# Patient Record
Sex: Male | Born: 1990 | Race: White | Hispanic: No | Marital: Single | Smoking: Never smoker
Health system: Southern US, Community
[De-identification: ages and names within clinical notes are randomized; demographics above are authoritative.]

## PROBLEM LIST (undated history)

## (undated) DIAGNOSIS — B2700 Gammaherpesviral mononucleosis without complication: Secondary | ICD-10-CM

## (undated) DIAGNOSIS — K819 Cholecystitis, unspecified: Secondary | ICD-10-CM

## (undated) DIAGNOSIS — K829 Disease of gallbladder, unspecified: Secondary | ICD-10-CM

## (undated) DIAGNOSIS — J189 Pneumonia, unspecified organism: Secondary | ICD-10-CM

## (undated) DIAGNOSIS — F909 Attention-deficit hyperactivity disorder, unspecified type: Secondary | ICD-10-CM

## (undated) HISTORY — PX: THUMB AMPUTATION: SHX804

---

## 2010-02-04 ENCOUNTER — Emergency Department (HOSPITAL_COMMUNITY)
Admission: EM | Admit: 2010-02-04 | Discharge: 2010-02-04 | Payer: Self-pay | Source: Home / Self Care | Admitting: Emergency Medicine

## 2010-04-26 LAB — BASIC METABOLIC PANEL
BUN: 10 mg/dL (ref 6–23)
Calcium: 8.8 mg/dL (ref 8.4–10.5)
Creatinine, Ser: 1.2 mg/dL (ref 0.4–1.5)

## 2010-04-26 LAB — CBC
HCT: 44.2 % (ref 39.0–52.0)
Hemoglobin: 16.3 g/dL (ref 13.0–17.0)
MCH: 31.5 pg (ref 26.0–34.0)
MCHC: 36.9 g/dL — ABNORMAL HIGH (ref 30.0–36.0)
MCV: 85.3 fL (ref 78.0–100.0)
RBC: 5.18 MIL/uL (ref 4.22–5.81)
WBC: 10.2 10*3/uL (ref 4.0–10.5)

## 2010-04-26 LAB — DIFFERENTIAL
Eosinophils Absolute: 0.3 10*3/uL (ref 0.0–0.7)
Monocytes Absolute: 0.8 10*3/uL (ref 0.1–1.0)
Monocytes Relative: 8 % (ref 3–12)
Neutro Abs: 7.9 10*3/uL — ABNORMAL HIGH (ref 1.7–7.7)

## 2014-01-12 ENCOUNTER — Emergency Department (HOSPITAL_COMMUNITY): Payer: Medicaid - Out of State

## 2014-01-12 ENCOUNTER — Inpatient Hospital Stay (HOSPITAL_COMMUNITY)
Admission: EM | Admit: 2014-01-12 | Discharge: 2014-01-21 | DRG: 871 | Disposition: A | Discharge status: Home / Self Care | Payer: Medicaid - Out of State | Attending: Pulmonary Disease | Admitting: Pulmonary Disease

## 2014-01-12 ENCOUNTER — Encounter (HOSPITAL_COMMUNITY): Payer: Self-pay | Admitting: Emergency Medicine

## 2014-01-12 DIAGNOSIS — F909 Attention-deficit hyperactivity disorder, unspecified type: Secondary | ICD-10-CM | POA: Diagnosis present

## 2014-01-12 DIAGNOSIS — E860 Dehydration: Secondary | ICD-10-CM | POA: Diagnosis present

## 2014-01-12 DIAGNOSIS — J9690 Respiratory failure, unspecified, unspecified whether with hypoxia or hypercapnia: Secondary | ICD-10-CM

## 2014-01-12 DIAGNOSIS — E871 Hypo-osmolality and hyponatremia: Secondary | ICD-10-CM | POA: Diagnosis present

## 2014-01-12 DIAGNOSIS — N179 Acute kidney failure, unspecified: Secondary | ICD-10-CM | POA: Diagnosis present

## 2014-01-12 DIAGNOSIS — R05 Cough: Secondary | ICD-10-CM | POA: Diagnosis present

## 2014-01-12 DIAGNOSIS — N390 Urinary tract infection, site not specified: Secondary | ICD-10-CM | POA: Diagnosis present

## 2014-01-12 DIAGNOSIS — J9601 Acute respiratory failure with hypoxia: Secondary | ICD-10-CM | POA: Diagnosis present

## 2014-01-12 DIAGNOSIS — N289 Disorder of kidney and ureter, unspecified: Secondary | ICD-10-CM

## 2014-01-12 DIAGNOSIS — R000 Tachycardia, unspecified: Secondary | ICD-10-CM

## 2014-01-12 DIAGNOSIS — A419 Sepsis, unspecified organism: Secondary | ICD-10-CM

## 2014-01-12 DIAGNOSIS — Z881 Allergy status to other antibiotic agents status: Secondary | ICD-10-CM

## 2014-01-12 DIAGNOSIS — K5900 Constipation, unspecified: Secondary | ICD-10-CM | POA: Diagnosis present

## 2014-01-12 DIAGNOSIS — R0902 Hypoxemia: Secondary | ICD-10-CM

## 2014-01-12 DIAGNOSIS — R1110 Vomiting, unspecified: Secondary | ICD-10-CM

## 2014-01-12 DIAGNOSIS — J9600 Acute respiratory failure, unspecified whether with hypoxia or hypercapnia: Secondary | ICD-10-CM

## 2014-01-12 DIAGNOSIS — Z4659 Encounter for fitting and adjustment of other gastrointestinal appliance and device: Secondary | ICD-10-CM

## 2014-01-12 DIAGNOSIS — R059 Cough, unspecified: Secondary | ICD-10-CM

## 2014-01-12 DIAGNOSIS — R6520 Severe sepsis without septic shock: Secondary | ICD-10-CM | POA: Diagnosis present

## 2014-01-12 DIAGNOSIS — D696 Thrombocytopenia, unspecified: Secondary | ICD-10-CM | POA: Diagnosis present

## 2014-01-12 DIAGNOSIS — E663 Overweight: Secondary | ICD-10-CM | POA: Diagnosis present

## 2014-01-12 DIAGNOSIS — Z6826 Body mass index (BMI) 26.0-26.9, adult: Secondary | ICD-10-CM | POA: Diagnosis not present

## 2014-01-12 DIAGNOSIS — J189 Pneumonia, unspecified organism: Secondary | ICD-10-CM | POA: Diagnosis present

## 2014-01-12 DIAGNOSIS — Z9289 Personal history of other medical treatment: Secondary | ICD-10-CM

## 2014-01-12 LAB — URINE MICROSCOPIC-ADD ON

## 2014-01-12 LAB — URINALYSIS, ROUTINE W REFLEX MICROSCOPIC
Glucose, UA: 100 mg/dL — AB
KETONES UR: 15 mg/dL — AB
Leukocytes, UA: NEGATIVE
NITRITE: NEGATIVE
SPECIFIC GRAVITY, URINE: 1.025 (ref 1.005–1.030)
Urobilinogen, UA: >8 mg/dL — ABNORMAL HIGH (ref 0.0–1.0)
pH: 6.5 (ref 5.0–8.0)

## 2014-01-12 LAB — COMPREHENSIVE METABOLIC PANEL
ALBUMIN: 3.3 g/dL — AB (ref 3.5–5.2)
ALT: 50 U/L (ref 0–53)
AST: 63 U/L — AB (ref 0–37)
Alkaline Phosphatase: 73 U/L (ref 39–117)
Anion gap: 14 (ref 5–15)
BUN: 15 mg/dL (ref 6–23)
CO2: 27 mEq/L (ref 19–32)
CREATININE: 1.47 mg/dL — AB (ref 0.50–1.35)
Calcium: 8.6 mg/dL (ref 8.4–10.5)
Chloride: 93 mEq/L — ABNORMAL LOW (ref 96–112)
GFR calc Af Amer: 76 mL/min — ABNORMAL LOW (ref >90)
GFR calc non Af Amer: 65 mL/min — ABNORMAL LOW (ref >90)
GLUCOSE: 105 mg/dL — AB (ref 70–99)
POTASSIUM: 4.1 meq/L (ref 3.7–5.3)
Sodium: 134 mEq/L — ABNORMAL LOW (ref 137–147)
TOTAL PROTEIN: 7.3 g/dL (ref 6.0–8.3)
Total Bilirubin: 2 mg/dL — ABNORMAL HIGH (ref 0.3–1.2)

## 2014-01-12 LAB — CBC WITH DIFFERENTIAL/PLATELET
Basophils Absolute: 0 10*3/uL (ref 0.0–0.1)
Basophils Relative: 0 % (ref 0–1)
EOS ABS: 0 10*3/uL (ref 0.0–0.7)
EOS PCT: 0 % (ref 0–5)
HCT: 45.1 % (ref 39.0–52.0)
HEMOGLOBIN: 15.8 g/dL (ref 13.0–17.0)
LYMPHS ABS: 0.5 10*3/uL — AB (ref 0.7–4.0)
Lymphocytes Relative: 9 % — ABNORMAL LOW (ref 12–46)
MCH: 30.2 pg (ref 26.0–34.0)
MCHC: 35 g/dL (ref 30.0–36.0)
MCV: 86.2 fL (ref 78.0–100.0)
MONOS PCT: 4 % (ref 3–12)
Monocytes Absolute: 0.2 10*3/uL (ref 0.1–1.0)
Neutro Abs: 4.6 10*3/uL (ref 1.7–7.7)
Neutrophils Relative %: 87 % — ABNORMAL HIGH (ref 43–77)
PLATELETS: 118 10*3/uL — AB (ref 150–400)
RBC: 5.23 MIL/uL (ref 4.22–5.81)
RDW: 12.8 % (ref 11.5–15.5)
WBC: 5.3 10*3/uL (ref 4.0–10.5)

## 2014-01-12 LAB — LIPASE, BLOOD: Lipase: 50 U/L (ref 11–59)

## 2014-01-12 MED ORDER — SODIUM CHLORIDE 0.9 % IV SOLN
INTRAVENOUS | Status: DC
  Administered 2014-01-12: 15:00:00 via INTRAVENOUS
  Administered 2014-01-16: 1000 mL via INTRAVENOUS
  Administered 2014-01-16 – 2014-01-18 (×4): via INTRAVENOUS

## 2014-01-12 MED ORDER — LEVOFLOXACIN IN D5W 750 MG/150ML IV SOLN
750.0000 mg | INTRAVENOUS | Status: DC
  Administered 2014-01-13 – 2014-01-16 (×4): 750 mg via INTRAVENOUS
  Filled 2014-01-12 (×5): qty 150

## 2014-01-12 MED ORDER — LEVOFLOXACIN IN D5W 750 MG/150ML IV SOLN
750.0000 mg | Freq: Once | INTRAVENOUS | Status: AC
  Administered 2014-01-12: 750 mg via INTRAVENOUS
  Filled 2014-01-12: qty 150

## 2014-01-12 MED ORDER — ACETAMINOPHEN 650 MG RE SUPP: 650.0000 mg | Freq: Four times a day (QID) | RECTAL | Status: DC | PRN

## 2014-01-12 MED ORDER — ONDANSETRON HCL 4 MG/2ML IJ SOLN
4.0000 mg | Freq: Four times a day (QID) | INTRAMUSCULAR | Status: DC | PRN
  Administered 2014-01-13 – 2014-01-16 (×2): 4 mg via INTRAVENOUS
  Filled 2014-01-12 (×2): qty 2

## 2014-01-12 MED ORDER — SODIUM CHLORIDE 0.9 % IV BOLUS (SEPSIS)
1000.0000 mL | Freq: Once | INTRAVENOUS | Status: AC
  Administered 2014-01-12: 1000 mL via INTRAVENOUS

## 2014-01-12 MED ORDER — LEVOFLOXACIN IN D5W 750 MG/150ML IV SOLN: 750.0000 mg | Freq: Once | INTRAVENOUS | Status: DC

## 2014-01-12 MED ORDER — HYDROCODONE-ACETAMINOPHEN 5-325 MG PO TABS
1.0000 | ORAL_TABLET | ORAL | Status: DC | PRN
  Administered 2014-01-13 – 2014-01-15 (×3): 2 via ORAL
  Filled 2014-01-12 (×3): qty 2

## 2014-01-12 MED ORDER — PROMETHAZINE HCL 12.5 MG PO TABS: 25.0000 mg | ORAL_TABLET | Freq: Four times a day (QID) | ORAL | Status: DC | PRN

## 2014-01-12 MED ORDER — ACETAMINOPHEN 10 MG/ML IV SOLN
1000.0000 mg | Freq: Once | INTRAVENOUS | Status: AC
  Administered 2014-01-12: 1000 mg via INTRAVENOUS
  Filled 2014-01-12: qty 100

## 2014-01-12 MED ORDER — IPRATROPIUM-ALBUTEROL 0.5-2.5 (3) MG/3ML IN SOLN
3.0000 mL | Freq: Once | RESPIRATORY_TRACT | Status: AC
  Administered 2014-01-12: 3 mL via RESPIRATORY_TRACT
  Filled 2014-01-12: qty 3

## 2014-01-12 MED ORDER — ACETAMINOPHEN 10 MG/ML IV SOLN
INTRAVENOUS | Status: AC
  Filled 2014-01-12: qty 100

## 2014-01-12 MED ORDER — ONDANSETRON HCL 4 MG/2ML IJ SOLN
4.0000 mg | INTRAMUSCULAR | Status: DC | PRN
  Administered 2014-01-12: 4 mg via INTRAVENOUS
  Filled 2014-01-12: qty 2

## 2014-01-12 MED ORDER — IBUPROFEN 800 MG/8ML IV SOLN: 400.0000 mg | INTRAVENOUS | Status: DC | PRN

## 2014-01-12 MED ORDER — DOCUSATE SODIUM 100 MG PO CAPS
100.0000 mg | ORAL_CAPSULE | Freq: Every day | ORAL | Status: DC | PRN
  Filled 2014-01-12: qty 1

## 2014-01-12 MED ORDER — ACETAMINOPHEN 325 MG PO TABS
650.0000 mg | ORAL_TABLET | Freq: Four times a day (QID) | ORAL | Status: DC | PRN
Start: 2014-01-12 — End: 2014-01-16
  Administered 2014-01-12 – 2014-01-14 (×4): 650 mg via ORAL
  Filled 2014-01-12 (×4): qty 2

## 2014-01-12 MED ORDER — GUAIFENESIN-DM 100-10 MG/5ML PO SYRP
5.0000 mL | ORAL_SOLUTION | ORAL | Status: DC | PRN
  Filled 2014-01-12 (×2): qty 5

## 2014-01-12 MED ORDER — ALUM & MAG HYDROXIDE-SIMETH 200-200-20 MG/5ML PO SUSP
30.0000 mL | Freq: Four times a day (QID) | ORAL | Status: DC | PRN
  Filled 2014-01-12: qty 30

## 2014-01-12 MED ORDER — SODIUM CHLORIDE 0.9 % IV SOLN
INTRAVENOUS | Status: DC
  Administered 2014-01-12 – 2014-01-15 (×8): via INTRAVENOUS

## 2014-01-12 NOTE — ED Notes (Signed)
Pt tolerated ambulation around nurses station with o2 lowest reading at 87% on room air and highest pulse at 162.

## 2014-01-12 NOTE — H&P (Signed)
History and Physical  Omar Rodriguez WUJ:[id number] DOB: 04/19/1990 DOA: 01/12/2014  Referring physician: Dr Clarene DukeMcManus, ED physician PCP: No PCP Per Patient   Chief Complaint: Cough, fever, vomiting  HPI: Omar Rodriguez is a 23 y.o. male  Who is otherwise healthy who presents to the emergency department with vomiting for 6 days with fever, chills, cough. His symptoms have been worsening. He was [facility] on Thursday and was diagnosed with gastroenteritis and sent home with Zofran. He continued to vomit despite the Zofran. He has a productive cough with green and yellow sputum production which has been worsening over the past several days. He has not been able to eat or drink without vomiting. Emesis just contains food contents.   Review of Systems:   Pt complains of abdominal pain, shortness of breath, musculoskeletal chest pain, myalgias.  Pt denies any wheezing, palpitations, headache, blurred vision, diarrhea, constipation.  Review of systems are otherwise negative  History reviewed. No pertinent past medical history. History reviewed. No pertinent past surgical history. Social History:  reports that he has never smoked. He has never used smokeless tobacco. He reports that he does not drink alcohol or use illicit drugs. Patient lives at home & is able to participate in activities of daily living  Allergies  Allergen Reactions  . Ceclor [Cefaclor] Hives    Family History  Problem Relation Age of Onset  . Diabetes Father   . Hypertension Father       Prior to Admission medications   Medication Sig Start Date End Date Taking? Authorizing Provider  ondansetron (ZOFRAN) 4 MG tablet Take 4 mg by mouth every 8 (eight) hours as needed for nausea or vomiting.   Yes Historical Provider, MD    Physical Exam: BP 112/74 mmHg  Pulse 136  Temp(Src) 97.8 F (36.6 C) (Oral)  Resp 22  Ht 5\' 8"  (1.727 m)  Wt 81.647 kg (180 lb)  BMI 27.38 kg/m2  SpO2 93%  General:  Young Caucasian male. Awake and alert and oriented x3. Patient is quite Tachypneic.  Additionally, patient is hypoxic at rest and in conversation me with oxygen saturations into the mid 80s with good waveform on the pulse oximetry. Eyes: Pupils equal, round, reactive to light. Extraocular muscles are intact. Sclerae anicteric and noninjected.  ENT: External auditory canals are patent and tympanic membranes reflect a good cone of light. Dry mucosal membranes. No mucosal lesions. Neck: Neck supple without lymphadenopathy. No carotid bruits. No masses palpated.  Cardiovascular: Tachycardic with normal S1-S2 sounds. No murmurs, rubs, gallops auscultated. No JVD.  Respiratory: Shallow breaths with rales in the left lower lobe and the right middle lobe. Abdomen: Soft, epigastric tenderness, nondistended. Active bowel sounds. No masses or hepatosplenomegaly  Skin: Dry, warm to touch. 2+ dorsalis pedis and radial pulses. Musculoskeletal: No calf or leg pain. All major joints not erythematous nontender. Ribs tender to palpation Psychiatric: Intact judgment and insight.  Neurologic: No focal neurological deficits. Cranial nerves II through XII are grossly intact.           Labs on Admission:  Basic Metabolic Panel:  Recent Labs Lab 01/12/14 1327  NA 134*  K 4.1  CL 93*  CO2 27  GLUCOSE 105*  BUN 15  CREATININE 1.47*  CALCIUM 8.6   Liver Function Tests:  Recent Labs Lab 01/12/14 1327  AST 63*  ALT 50  ALKPHOS 73  BILITOT 2.0*  PROT 7.3  ALBUMIN 3.3*    Recent Labs Lab 01/12/14  1327  LIPASE 50   No results for input(s): AMMONIA in the last 168 hours. CBC:  Recent Labs Lab 01/12/14 1327  WBC 5.3  NEUTROABS 4.6  HGB 15.8  HCT 45.1  MCV 86.2  PLT 118*   Cardiac Enzymes: No results for input(s): CKTOTAL, CKMB, CKMBINDEX, TROPONINI in the last 168 hours.  BNP (last 3 results) No results for input(s): PROBNP in the last 8760 hours. CBG: No results for input(s): GLUCAP  in the last 168 hours.  Radiological Exams on Admission: Dg Chest 2 View  01/12/2014   CLINICAL DATA:  Productive cough.  EXAM: CHEST  2 VIEW  COMPARISON:  January 09, 2014.  FINDINGS: The heart size and mediastinal contours are within normal limits. No pneumothorax or pleural effusion is noted. Interval development of large airspace opacity seen in left lower lobe consistent with pneumonia. New right perihilar opacity is noted also consistent with pneumonia. The visualized skeletal structures are unremarkable.  IMPRESSION: New left lower lobe and right perihilar pneumonia. Follow-up radiographs are recommended to ensure resolution.   Electronically Signed   By: Roque LiasJames  Green M.D.   On: 01/12/2014 14:11    Assessment/Plan Present on Admission:  . Acute respiratory failure with hypoxia . Dehydration . CAP (community acquired pneumonia) . UTI (lower urinary tract infection)  #1 acute respiratory failure with hypoxia #2 left lower lobe and right perihilar pneumonia - community-acquired #3 severe dehydration secondary to #2 #4 acute renal insufficiency secondary to #3 #5 question of a UTI  Due to the severe dehydration, continued hypoxia at rest, will admit the patient for the community-acquired pneumonia. Continue patient on Levaquin. Patient receiving third bolus of fluid in the ER - we'll continue at 150 ML's per hour, which should resolve the patient's renal insufficiency secondary to his dehydration.  Although the urine dip is not impressive for infection with the absent leukocytes and negative nitrites, there are many bacteria on the micro with rare squamous cells. Levaquin should cover most pathogens.    DVT prophylaxis: Ambulation - patient at low risk due to age  Consultants: None  Code Status: Full code  Family Communication: Father and mother in the room   Disposition Plan: Home following improvement  Time spent: 8470 minutes  Omar CelesteJacob Brilynn Rodriguez, [place]DO [facility] Pager  [phone]

## 2014-01-12 NOTE — ED Notes (Signed)
Given water and regular gingerale.

## 2014-01-12 NOTE — ED Notes (Signed)
Patient c/o cough, fever, and vomiting x1 week. Patient seen at [facility] last [facility], diagnosed with UTI and viral infection- given Zofran with no relief from vomiting. Patient reports chest pain with cough. Per patient highest fever 103.6. Patient also reports headache.

## 2014-01-12 NOTE — ED Notes (Signed)
Tolerated po intake well.  Drank about 12 ounces of water.

## 2014-01-12 NOTE — ED Provider Notes (Signed)
CSN: [id number]     Arrival date & time 01/12/14  1204 History   First MD Initiated Contact with Patient 01/12/14 1310     Chief Complaint  Patient presents with  . Cough  . Emesis      HPI  Pt was seen at 1320.  Per pt, c/o gradual onset and persistence of constant cough for the past 1 week. Has been associated with multiple intermittent episodes of N/V, generalized body aches/fatigue, chills and home fevers to "103.6." Pt was evaluated at [facility] ED 3 days ago, dx with "viral infection," rx zofran. States he continues to have N/V despite taking the zofran as rx. Denies rash, no CP/SOB, no diarrhea, no abd pain.    History reviewed. No pertinent past medical history.   History reviewed. No pertinent past surgical history.   Family History  Problem Relation Age of Onset  . Diabetes Father   . Hypertension Father    History  Substance Use Topics  . Smoking status: Never Smoker   . Smokeless tobacco: Never Used  . Alcohol Use: No    Review of Systems ROS: Statement: All systems negative except as marked or noted in the HPI; Constitutional: +fever and chills, generalized body aches/fatigue. ; ; Eyes: Negative for eye pain, redness and discharge. ; ; ENMT: Negative for ear pain, hoarseness, nasal congestion, sinus pressure and sore throat. ; ; Cardiovascular: Negative for chest pain, palpitations, diaphoresis, dyspnea and peripheral edema. ; ; Respiratory: +cough. Negative for wheezing and stridor. ; ; Gastrointestinal: +N/V. Negative for diarrhea, abdominal pain, blood in stool, hematemesis, jaundice and rectal bleeding. . ; ; Genitourinary: Negative for dysuria, flank pain and hematuria. ; ; Musculoskeletal: Negative for back pain and neck pain. Negative for swelling and trauma.; ; Skin: Negative for pruritus, rash, abrasions, blisters, bruising and skin lesion.; ; Neuro: Negative for headache, lightheadedness and neck stiffness. Negative for weakness, altered level of consciousness ,  altered mental status, extremity weakness, paresthesias, involuntary movement, seizure and syncope.      Allergies  Ceclor  Home Medications   Prior to Admission medications   Medication Sig Start Date End Date Taking? Authorizing Provider  ondansetron (ZOFRAN) 4 MG tablet Take 4 mg by mouth every 8 (eight) hours as needed for nausea or vomiting.   Yes Historical Provider, MD   BP 135/87 mmHg  Pulse 130  Temp(Src) 97.8 F (36.6 C) (Oral)  Resp 18  Ht 5\' 8"  (1.727 m)  Wt 180 lb (81.647 kg)  BMI 27.38 kg/m2  SpO2 91% Physical Exam  1325: Physical examination:  Nursing notes reviewed; Vital signs and O2 SAT reviewed;  Constitutional: Well developed, Well nourished, In no acute distress; Head:  Normocephalic, atraumatic; Eyes: EOMI, PERRL, No scleral icterus; ENMT: Mouth and pharynx normal, Mucous membranes dry and cracked; Neck: Supple, Full range of motion, No lymphadenopathy. No meningeal signs; Cardiovascular: Tachycardic rate and rhythm, No gallop; Respiratory: Breath sounds coarse & equal bilaterally, No wheezes. +moist cough during exam. Speaking full sentences with ease, Normal respiratory effort/excursion; Chest: Nontender, Movement normal; Abdomen: Soft, Nontender, Nondistended, Normal bowel sounds; Genitourinary: No CVA tenderness; Extremities: Pulses normal, No tenderness, No edema, No calf edema or asymmetry.; Neuro: AA&Ox3, Major CN grossly intact.  Speech clear. No gross focal motor or sensory deficits in extremities.; Skin: Color normal, Warm, Dry.   ED Course  Procedures     EKG Interpretation None      MDM  MDM Reviewed: previous chart, nursing note and vitals Reviewed previous:  labs Interpretation: x-ray and labs    Results for orders placed or performed during the hospital encounter of 01/12/14  Urinalysis, Routine w reflex microscopic  Result Value Ref Range   Color, Urine AMBER (A) YELLOW   APPearance CLOUDY (A) CLEAR   Specific Gravity, Urine 1.025  1.005 - 1.030   pH 6.5 5.0 - 8.0   Glucose, UA 100 (A) NEGATIVE mg/dL   Hgb urine dipstick LARGE (A) NEGATIVE   Bilirubin Urine MODERATE (A) NEGATIVE   Ketones, ur 15 (A) NEGATIVE mg/dL   Protein, ur >161>300 (A) NEGATIVE mg/dL   Urobilinogen, UA >0.9>8.0 (H) 0.0 - 1.0 mg/dL   Nitrite NEGATIVE NEGATIVE   Leukocytes, UA NEGATIVE NEGATIVE  CBC with Differential  Result Value Ref Range   WBC 5.3 4.0 - 10.5 K/uL   RBC 5.23 4.22 - 5.81 MIL/uL   Hemoglobin 15.8 13.0 - 17.0 g/dL   HCT 60.445.1 54.039.0 - 98.152.0 %   MCV 86.2 78.0 - 100.0 fL   MCH 30.2 26.0 - 34.0 pg   MCHC 35.0 30.0 - 36.0 g/dL   RDW 19.112.8 47.811.5 - 29.515.5 %   Platelets 118 (L) 150 - 400 K/uL   Neutrophils Relative % 87 (H) 43 - 77 %   Neutro Abs 4.6 1.7 - 7.7 K/uL   Lymphocytes Relative 9 (L) 12 - 46 %   Lymphs Abs 0.5 (L) 0.7 - 4.0 K/uL   Monocytes Relative 4 3 - 12 %   Monocytes Absolute 0.2 0.1 - 1.0 K/uL   Eosinophils Relative 0 0 - 5 %   Eosinophils Absolute 0.0 0.0 - 0.7 K/uL   Basophils Relative 0 0 - 1 %   Basophils Absolute 0.0 0.0 - 0.1 K/uL   Smear Review SPECIMEN CHECKED FOR CLOTS   Comprehensive metabolic panel  Result Value Ref Range   Sodium 134 (L) 137 - 147 mEq/L   Potassium 4.1 3.7 - 5.3 mEq/L   Chloride 93 (L) 96 - 112 mEq/L   CO2 27 19 - 32 mEq/L   Glucose, Bld 105 (H) 70 - 99 mg/dL   BUN 15 6 - 23 mg/dL   Creatinine, Ser 6.211.47 (H) 0.50 - 1.35 mg/dL   Calcium 8.6 8.4 - 30.810.5 mg/dL   Total Protein 7.3 6.0 - 8.3 g/dL   Albumin 3.3 (L) 3.5 - 5.2 g/dL   AST 63 (H) 0 - 37 U/L   ALT 50 0 - 53 U/L   Alkaline Phosphatase 73 39 - 117 U/L   Total Bilirubin 2.0 (H) 0.3 - 1.2 mg/dL   GFR calc non Af Amer 65 (L) >90 mL/min   GFR calc Af Amer 76 (L) >90 mL/min   Anion gap 14 5 - 15  Lipase, blood  Result Value Ref Range   Lipase 50 11 - 59 U/L  Urine microscopic-add on  Result Value Ref Range   Squamous Epithelial / LPF RARE RARE   WBC, UA 7-10 <3 WBC/hpf   RBC / HPF 7-10 <3 RBC/hpf   Bacteria, UA MANY (A) RARE    Casts GRANULAR CAST (A) NEGATIVE   Dg Chest 2 View 01/12/2014   CLINICAL DATA:  Productive cough.  EXAM: CHEST  2 VIEW  COMPARISON:  January 09, 2014.  FINDINGS: The heart size and mediastinal contours are within normal limits. No pneumothorax or pleural effusion is noted. Interval development of large airspace opacity seen in left lower lobe consistent with pneumonia. New right perihilar opacity is noted also consistent with pneumonia.  The visualized skeletal structures are unremarkable.  IMPRESSION: New left lower lobe and right perihilar pneumonia. Follow-up radiographs are recommended to ensure resolution.   Electronically Signed   By: Roque LiasJames  Green M.D.   On: 01/12/2014 14:11    1555:  Pt tachycardic to 130's on arrival. BUN/Cr elevated c/w pt appearing clinically dehydrated. IVF x2L given without improvement. Nebs x2 given for O2 Sats 91% R/A at rest with minimal improvement. Pt ambulated with O2 Sats dropping to 87% R/A, HR increasing to 160's. Will continue IVF, start IV abx for CAP and UTI (UC pending), and admit. Dx and testing d/w pt and family.  Questions answered.  Verb understanding, agreeable to admit. T/C to [facility]. Adrian BlackwaterStinson, case discussed, including:  HPI, pertinent PM/SHx, VS/PE, dx testing, ED course and treatment:  Agreeable to come to ED for evaluation for admission.  Samuel JesterKathleen Thi Sisemore, DO 01/13/14 [phone]

## 2014-01-13 ENCOUNTER — Inpatient Hospital Stay (HOSPITAL_COMMUNITY): Payer: Medicaid - Out of State

## 2014-01-13 DIAGNOSIS — A419 Sepsis, unspecified organism: Secondary | ICD-10-CM

## 2014-01-13 LAB — CBC WITH DIFFERENTIAL/PLATELET
BASOS PCT: 0 % (ref 0–1)
Basophils Absolute: 0 10*3/uL (ref 0.0–0.1)
Eosinophils Absolute: 0 10*3/uL (ref 0.0–0.7)
Eosinophils Relative: 0 % (ref 0–5)
HEMATOCRIT: 41.1 % (ref 39.0–52.0)
Hemoglobin: 14.2 g/dL (ref 13.0–17.0)
LYMPHS PCT: 13 % (ref 12–46)
Lymphs Abs: 0.6 10*3/uL — ABNORMAL LOW (ref 0.7–4.0)
MCH: 30.1 pg (ref 26.0–34.0)
MCHC: 34.5 g/dL (ref 30.0–36.0)
MCV: 87.1 fL (ref 78.0–100.0)
MONO ABS: 0.3 10*3/uL (ref 0.1–1.0)
MONOS PCT: 6 % (ref 3–12)
Neutro Abs: 3.9 10*3/uL (ref 1.7–7.7)
Neutrophils Relative %: 81 % — ABNORMAL HIGH (ref 43–77)
Platelets: 107 10*3/uL — ABNORMAL LOW (ref 150–400)
RBC: 4.72 MIL/uL (ref 4.22–5.81)
RDW: 12.9 % (ref 11.5–15.5)
WBC: 4.8 10*3/uL (ref 4.0–10.5)

## 2014-01-13 LAB — COMPREHENSIVE METABOLIC PANEL
ALT: 39 U/L (ref 0–53)
ANION GAP: 14 (ref 5–15)
AST: 52 U/L — AB (ref 0–37)
Albumin: 2.4 g/dL — ABNORMAL LOW (ref 3.5–5.2)
Alkaline Phosphatase: 65 U/L (ref 39–117)
BILIRUBIN TOTAL: 1.2 mg/dL (ref 0.3–1.2)
BUN: 10 mg/dL (ref 6–23)
CHLORIDE: 101 meq/L (ref 96–112)
CO2: 24 meq/L (ref 19–32)
CREATININE: 1.21 mg/dL (ref 0.50–1.35)
Calcium: 7.5 mg/dL — ABNORMAL LOW (ref 8.4–10.5)
GFR calc Af Amer: >90 mL/min (ref >90)
GFR, EST NON AFRICAN AMERICAN: 83 mL/min — AB (ref >90)
Glucose, Bld: 96 mg/dL (ref 70–99)
Potassium: 4.2 mEq/L (ref 3.7–5.3)
Sodium: 139 mEq/L (ref 137–147)
Total Protein: 5.7 g/dL — ABNORMAL LOW (ref 6.0–8.3)

## 2014-01-13 LAB — BLOOD GAS, ARTERIAL
ACID-BASE DEFICIT: 0.8 mmol/L (ref 0.0–2.0)
Acid-base deficit: 0.6 mmol/L (ref 0.0–2.0)
BICARBONATE: 23.8 meq/L (ref 20.0–24.0)
Bicarbonate: 23.2 mEq/L (ref 20.0–24.0)
Drawn by: 21310
FIO2: 100 %
O2 CONTENT: 6 L/min
O2 Saturation: 91.8 %
O2 Saturation: 92.8 %
PCO2 ART: 41 mmHg (ref 35.0–45.0)
PCO2 ART: 36.9 mmHg (ref 35.0–45.0)
PH ART: 7.382 (ref 7.350–7.450)
PO2 ART: 62.4 mmHg — AB (ref 80.0–100.0)
PO2 ART: 64.7 mmHg — AB (ref 80.0–100.0)
Patient temperature: 37
Patient temperature: 37
TCO2: 21.1 mmol/L (ref 0–100)
TCO2: 20.6 mmol/L (ref 0–100)
pH, Arterial: 7.415 (ref 7.350–7.450)

## 2014-01-13 LAB — STREP PNEUMONIAE URINARY ANTIGEN: Strep Pneumo Urinary Antigen: NEGATIVE

## 2014-01-13 LAB — BASIC METABOLIC PANEL
Anion gap: 16 — ABNORMAL HIGH (ref 5–15)
BUN: 11 mg/dL (ref 6–23)
CO2: 22 meq/L (ref 19–32)
Calcium: 7.5 mg/dL — ABNORMAL LOW (ref 8.4–10.5)
Chloride: 100 mEq/L (ref 96–112)
Creatinine, Ser: 1.23 mg/dL (ref 0.50–1.35)
GFR calc Af Amer: >90 mL/min (ref >90)
GFR calc non Af Amer: 81 mL/min — ABNORMAL LOW (ref >90)
GLUCOSE: 85 mg/dL (ref 70–99)
POTASSIUM: 4.3 meq/L (ref 3.7–5.3)
Sodium: 138 mEq/L (ref 137–147)

## 2014-01-13 LAB — EXPECTORATED SPUTUM ASSESSMENT W GRAM STAIN, RFLX TO RESP C

## 2014-01-13 LAB — CBC
HCT: 40.9 % (ref 39.0–52.0)
HEMOGLOBIN: 14.2 g/dL (ref 13.0–17.0)
MCH: 30.2 pg (ref 26.0–34.0)
MCHC: 34.7 g/dL (ref 30.0–36.0)
MCV: 87 fL (ref 78.0–100.0)
Platelets: 100 10*3/uL — ABNORMAL LOW (ref 150–400)
RBC: 4.7 MIL/uL (ref 4.22–5.81)
RDW: 12.9 % (ref 11.5–15.5)
WBC: 4.9 10*3/uL (ref 4.0–10.5)

## 2014-01-13 LAB — INFLUENZA PANEL BY PCR (TYPE A & B)
H1N1 flu by pcr: NOT DETECTED
INFLAPCR: NEGATIVE
INFLBPCR: NEGATIVE

## 2014-01-13 LAB — PHOSPHORUS: PHOSPHORUS: 2.6 mg/dL (ref 2.3–4.6)

## 2014-01-13 LAB — EXPECTORATED SPUTUM ASSESSMENT W REFEX TO RESP CULTURE

## 2014-01-13 LAB — LACTIC ACID, PLASMA: Lactic Acid, Venous: 1.3 mmol/L (ref 0.5–2.2)

## 2014-01-13 LAB — PRO B NATRIURETIC PEPTIDE: Pro B Natriuretic peptide (BNP): 691.1 pg/mL — ABNORMAL HIGH (ref 0–125)

## 2014-01-13 LAB — MRSA PCR SCREENING: MRSA by PCR: NEGATIVE

## 2014-01-13 MED ORDER — LEVALBUTEROL HCL 0.63 MG/3ML IN NEBU
INHALATION_SOLUTION | RESPIRATORY_TRACT | Status: AC
  Filled 2014-01-13: qty 3

## 2014-01-13 MED ORDER — PIPERACILLIN-TAZOBACTAM 3.375 G IVPB
3.3750 g | Freq: Three times a day (TID) | INTRAVENOUS | Status: DC
  Administered 2014-01-13 – 2014-01-20 (×22): 3.375 g via INTRAVENOUS
  Filled 2014-01-13 (×28): qty 50

## 2014-01-13 MED ORDER — SODIUM CHLORIDE 0.9 % IV BOLUS (SEPSIS)
500.0000 mL | Freq: Once | INTRAVENOUS | Status: AC
  Administered 2014-01-13: 500 mL via INTRAVENOUS

## 2014-01-13 MED ORDER — VANCOMYCIN HCL IN DEXTROSE 1-5 GM/200ML-% IV SOLN
1000.0000 mg | Freq: Two times a day (BID) | INTRAVENOUS | Status: DC
  Administered 2014-01-13 – 2014-01-14 (×3): 1000 mg via INTRAVENOUS
  Filled 2014-01-13 (×6): qty 200

## 2014-01-13 MED ORDER — VANCOMYCIN HCL IN DEXTROSE 1-5 GM/200ML-% IV SOLN: 1000.0000 mg | Freq: Two times a day (BID) | INTRAVENOUS | Status: DC

## 2014-01-13 MED ORDER — LEVALBUTEROL HCL 0.63 MG/3ML IN NEBU
0.6300 mg | INHALATION_SOLUTION | Freq: Four times a day (QID) | RESPIRATORY_TRACT | Status: DC
  Administered 2014-01-13 – 2014-01-16 (×12): 0.63 mg via RESPIRATORY_TRACT
  Filled 2014-01-13 (×14): qty 3

## 2014-01-13 MED ORDER — SODIUM CHLORIDE 0.9 % IV SOLN
1500.0000 mg | Freq: Once | INTRAVENOUS | Status: DC
  Filled 2014-01-13: qty 1500

## 2014-01-13 MED ORDER — LORAZEPAM 2 MG/ML IJ SOLN
0.5000 mg | Freq: Once | INTRAMUSCULAR | Status: AC
  Administered 2014-01-13: 0.5 mg via INTRAVENOUS
  Filled 2014-01-13: qty 1

## 2014-01-13 MED ORDER — CHLORHEXIDINE GLUCONATE 0.12 % MT SOLN
15.0000 mL | Freq: Two times a day (BID) | OROMUCOSAL | Status: DC
  Administered 2014-01-13 – 2014-01-15 (×5): 15 mL via OROMUCOSAL
  Filled 2014-01-13 (×5): qty 15

## 2014-01-13 MED ORDER — CETYLPYRIDINIUM CHLORIDE 0.05 % MT LIQD
7.0000 mL | Freq: Two times a day (BID) | OROMUCOSAL | Status: DC
  Administered 2014-01-13 – 2014-01-15 (×3): 7 mL via OROMUCOSAL

## 2014-01-13 MED ORDER — GUAIFENESIN ER 600 MG PO TB12
1200.0000 mg | ORAL_TABLET | Freq: Two times a day (BID) | ORAL | Status: DC
  Administered 2014-01-13 – 2014-01-15 (×4): 1200 mg via ORAL
  Filled 2014-01-13 (×5): qty 2

## 2014-01-13 MED ORDER — PIPERACILLIN-TAZOBACTAM 3.375 G IVPB
3.3750 g | Freq: Three times a day (TID) | INTRAVENOUS | Status: DC
  Filled 2014-01-13: qty 50

## 2014-01-13 MED ORDER — OSELTAMIVIR PHOSPHATE 75 MG PO CAPS: 75.0000 mg | ORAL_CAPSULE | Freq: Two times a day (BID) | ORAL | Status: DC

## 2014-01-13 MED ORDER — VANCOMYCIN HCL 10 G IV SOLR
1500.0000 mg | Freq: Once | INTRAVENOUS | Status: AC
  Administered 2014-01-13: 1500 mg via INTRAVENOUS
  Filled 2014-01-13: qty 1500

## 2014-01-13 NOTE — Progress Notes (Signed)
eLink Physician-Brief Progress Note Patient Name: Omar Rodriguez DOB: 11/30/1990 MRN: [id number]   Date of Service  01/13/2014  HPI/Events of Note  23 year old male admitted following 5 day h/o of resp symptoms.  Now requiring higher O2 needs up to 5 liters [place] with sats in the low 90s and RR of 25 to 36.  HD stable but tachycardic.  PCXR shows enlarging left sided opacification consistent with PNA.  First dose of ABX at 4pm on 11/29.  ABG consistent with hypoxia.  eICU Interventions  Plan: Per primary team Continue with current treatment Low threshold to broaden coverage if patient remains hypoxic/unresponsive Consider BiPAP May require pulm consult     Intervention Category Evaluation Type: New Patient Evaluation  DETERDING,ELIZABETH 01/13/2014, 6:26 AM

## 2014-01-13 NOTE — Progress Notes (Signed)
Patients oxygen saturations have been varied from 95% to a low of 79% with oxygen by nasal cannula at 6lpm. Patient remained at 79% for extended time and we had to change to non-rebreather mask at 15 lpm flow with saturations now at 90%.

## 2014-01-13 NOTE — Consult Note (Signed)
Full note to follow. He is on appropriate treatment.

## 2014-01-13 NOTE — Clinical Documentation Improvement (Signed)
Medicare rules require specification as to whether an inpatient diagnosis was present at the time of admission.   "Sepsis" documented in progress note dated 01/13/14.  Sepsis also documented on current hospital problem list but Present on Admission is documented as "Unknown".  Please document if Sepsis was:   - Present on Admission, including clinical indicators   - Not Present on Admission but occurred following admission   - Unable to Clinically Determine   Thank You, Jerral Ralphathy R Rockelle Heuerman ,RN Clinical Documentation Specialist:  [phone] [facility]- Health Information Management

## 2014-01-13 NOTE — Progress Notes (Signed)
Patient transferred to icu/stepdown for closer monitoring and for higher level of care.  Father present with patient.  Patient in stable condition. Report given to icu nurse.

## 2014-01-13 NOTE — Progress Notes (Addendum)
Notified MD on call to inform him of patient's need for oxygen at 5liters. Patient' heart rate has decreased and patient's temperature.  Patient in no visible forms of respiratory distress. Patient's mentation is at baseline and has been alert and responsive all night.  Received orders from md.  Will continue to monitor patient condition.

## 2014-01-13 NOTE — Plan of Care (Signed)
Problem: Phase I Progression Outcomes Goal: Voiding-avoid urinary catheter unless indicated Outcome: Completed/Met Date Met:  01/13/14     

## 2014-01-13 NOTE — Progress Notes (Signed)
eLink Physician-Brief Progress Note Patient Name: Jonna Coupony C Hogle DOB: 05/21/1990 MRN: [id number]   Date of Service  01/13/2014  HPI/Events of Note    eICU Interventions  SCD placed     Intervention Category Intermediate Interventions: Best-practice therapies (e.g. DVT, beta blocker, etc.)  BYRUM,ROBERT S. 01/13/2014, 10:12 PM

## 2014-01-13 NOTE — Progress Notes (Signed)
[facility] PROGRESS NOTE  Omar Rodriguez EXB:[id number] DOB: 12/24/1990 DOA: 01/12/2014 PCP: No PCP Per Patient  Assessment/Plan:   Sepsis - Will continue to monitor blood pressures. - continue IVF rehydration - Broad spectrum antibiotics on board - obtain sputum culture - blood cultures not obtained initially. Will order but will keeping in mind that patient has already had antibiotics as such may affect results.  Acute respiratory failure with hypoxia - Most likely due to multifocal pneumonia - continue supplemental oxygen    Acute renal insufficiency - Suspect most likely 2ary to prerenal causes. - Improving with IVF rehydration - Will continue IVF's    Dehydration - Improved with IVF rehydration. - As patient continues to improve will have nursing encourage po intake    CAP (community acquired pneumonia) - Continue broad spectrum antibiotics - discussed with nursing about obtaining sputum culture. I have been indicated that this has occurred. - continue broad spectrum antibiotics  Code Status: full Family Communication: discussed with patient and family at bedside  Disposition Plan: Pending improvement in condition   Consultants:  None  Procedures:  None  Antibiotics:  Levaquin  Vancomycin  Zosyn  HPI/Subjective: Pt has no new complaints. No acute issues reported overnight. Family states that patient is more alert than he has been  Objective: Filed Vitals:   01/13/14 0900  BP:   Pulse: 122  Temp:   Resp: 34    Intake/Output Summary (Last 24 hours) at 01/13/14 0947 Last data filed at 01/13/14 [id number]  Gross per 24 hour  Intake   2505 ml  Output   1200 ml  Net   1305 ml   Filed Weights   01/12/14 1214 01/12/14 1709 01/13/14 0611  Weight: 81.647 kg (180 lb) 76.295 kg (168 lb 3.2 oz) 76.1 kg (167 lb 12.3 oz)    Exam:   General:  Pt in nad, alert and awake  Cardiovascular: rrr, no mrg  Respiratory: +Rhales, no wheezes, speaking in  full sentences  Abdomen: soft, Nd, nt   Musculoskeletal: no cyanosis or clubbing on limited exam   Data Reviewed: Basic Metabolic Panel:  Recent Labs Lab 01/12/14 1327 01/13/14 0728  NA 134* 139  K 4.1 4.2  CL 93* 101  CO2 27 24  GLUCOSE 105* 96  BUN 15 10  CREATININE 1.47* 1.21  CALCIUM 8.6 7.5*   Liver Function Tests:  Recent Labs Lab 01/12/14 1327 01/13/14 0728  AST 63* 52*  ALT 50 39  ALKPHOS 73 65  BILITOT 2.0* 1.2  PROT 7.3 5.7*  ALBUMIN 3.3* 2.4*    Recent Labs Lab 01/12/14 1327  LIPASE 50   No results for input(s): AMMONIA in the last 168 hours. CBC:  Recent Labs Lab 01/12/14 1327 01/13/14 0728  WBC 5.3 4.8  4.9  NEUTROABS 4.6 3.9  HGB 15.8 14.2  14.2  HCT 45.1 41.1  40.9  MCV 86.2 87.1  87.0  PLT 118* 107*  100*   Cardiac Enzymes: No results for input(s): CKTOTAL, CKMB, CKMBINDEX, TROPONINI in the last 168 hours. BNP (last 3 results) No results for input(s): PROBNP in the last 8760 hours. CBG: No results for input(s): GLUCAP in the last 168 hours.  No results found for this or any previous visit (from the past 240 hour(s)).   Studies: Dg Chest 2 View  01/12/2014   CLINICAL DATA:  Productive cough.  EXAM: CHEST  2 VIEW  COMPARISON:  January 09, 2014.  FINDINGS: The heart size and mediastinal contours are  within normal limits. No pneumothorax or pleural effusion is noted. Interval development of large airspace opacity seen in left lower lobe consistent with pneumonia. New right perihilar opacity is noted also consistent with pneumonia. The visualized skeletal structures are unremarkable.  IMPRESSION: New left lower lobe and right perihilar pneumonia. Follow-up radiographs are recommended to ensure resolution.   Electronically Signed   By: Roque LiasJames  Green M.D.   On: 01/12/2014 14:11   Ct Chest Wo Contrast  01/13/2014   CLINICAL DATA:  Fever, chills, cough, dehydration. Community acquired pneumonia.  EXAM: CT CHEST WITHOUT CONTRAST   TECHNIQUE: Multidetector CT imaging of the chest was performed following the standard protocol without IV contrast.  COMPARISON:  PA and lateral chest 01/09/2014 and 01/12/2014. Single view of the chest 01/13/2014.  FINDINGS: Trace right pleural effusion is identified. The patient has a small left pleural effusion. There is no pericardial effusion. Heart size is normal. There is no axillary, hilar or mediastinal lymphadenopathy. Extensive airspace disease is present in the left chest, worst in left lower lobe. Airspace disease is also seen in the right upper lobe. There is minimal dependent atelectasis in the right lung base.  Incidentally imaged upper abdomen is unremarkable. No focal bony abnormality is identified.  IMPRESSION: Airspace disease in the right upper lobe and throughout the left chest is most consistent with multifocal pneumonia. Associated small bilateral pleural effusions, greater on the left, are identified.   Electronically Signed   By: Drusilla Kannerhomas  Dalessio M.D.   On: 01/13/2014 08:52   Dg Chest Port 1 View  01/13/2014   CLINICAL DATA:  Acute onset of cough, fever and vomiting for 6 days. Decreased O2 saturation. Acute respiratory failure. Subsequent encounter.  EXAM: PORTABLE CHEST - 1 VIEW  COMPARISON:  Chest radiograph performed 01/12/2014  FINDINGS: There is significantly worsened left-sided airspace opacification, with mild sparing at the left lung apex. Mildly worsened right perihilar airspace opacity is also seen. Given the appearance, this is suspicious for severe multifocal pneumonia. A small left pleural effusion may be present. Underlying pulmonary edema cannot be excluded. No pneumothorax is seen.  The cardiomediastinal silhouette is not well assessed due to the opacification of the left hemithorax. No acute osseous abnormalities are identified.  IMPRESSION: Significantly worsened left-sided airspace opacification. Mildly worsened right perihilar airspace opacity also seen. Given the  appearance, this is suspicious for worsening severe multifocal pneumonia. Question of small left pleural effusion. Underlying pulmonary edema cannot be excluded.   Electronically Signed   By: Roanna RaiderJeffery  Chang M.D.   On: 01/13/2014 03:46    Scheduled Meds: . antiseptic oral rinse  7 mL Mouth Rinse q12n4p  . chlorhexidine  15 mL Mouth Rinse BID  . levalbuterol  0.63 mg Nebulization Q6H WA  . levofloxacin (LEVAQUIN) IV  750 mg Intravenous Q24H  . piperacillin-tazobactam (ZOSYN)  IV  3.375 g Intravenous Q8H  . sodium chloride  500 mL Intravenous Once  . vancomycin  1,500 mg Intravenous Once  . vancomycin  1,000 mg Intravenous Q12H   Continuous Infusions: . sodium chloride Stopped (01/12/14 1655)  . sodium chloride 150 mL/hr at 01/12/14 1807    Active Problems:   Time spent: > 35 minutes    Penny PiaVEGA, Denecia Brunette  [facility] Pager [phone]. If 7PM-7AM, please contact night-coverage at [email or web address], password [email or web address] 01/13/2014, 9:47 AM  LOS: 1 day

## 2014-01-13 NOTE — Care Management Utilization Note (Signed)
UR review complete.  

## 2014-01-13 NOTE — Progress Notes (Signed)
ANTIBIOTIC CONSULT NOTE - INITIAL  Pharmacy Consult for vancomycin & Zosyn Indication: aspiration pneumonia  Allergies  Allergen Reactions  . Ceclor [Cefaclor] Hives    Patient Measurements: Height: 5\' 8"  (172.7 cm) Weight: 167 lb 12.3 oz (76.1 kg) IBW/kg (Calculated) : 68.4 Adjusted Body Weight: 71 kg  Vital Signs: Temp: 99.4 F (37.4 C) (11/30 0611) Temp Source: Oral (11/30 62950611) BP: 115/77 mmHg (11/30 28410611) Pulse Rate: 112 (11/30 0539) Intake/Output from previous day: 11/29 0701 - 11/30 0700 In: 240 [P.O.:240] Out: 1200 [Urine:1200] Intake/Output from this shift: Total I/O In: -  Out: 800 [Urine:800]  Labs:  Recent Labs  01/12/14 1327  WBC 5.3  HGB 15.8  PLT 118*  CREATININE 1.47*   Estimated Creatinine Clearance: 75 mL/min (by C-G formula based on Cr of 1.47). No results for input(s): VANCOTROUGH, VANCOPEAK, VANCORANDOM, GENTTROUGH, GENTPEAK, GENTRANDOM, TOBRATROUGH, TOBRAPEAK, TOBRARND, AMIKACINPEAK, AMIKACINTROU, AMIKACIN in the last 72 hours.   Microbiology: No results found for this or any previous visit (from the past 720 hour(s)).  Medical History: History reviewed. No pertinent past medical history.  Medications:  Scheduled:  . antiseptic oral rinse  7 mL Mouth Rinse q12n4p  . chlorhexidine  15 mL Mouth Rinse BID  . levalbuterol  0.63 mg Nebulization Q6H WA  . levofloxacin (LEVAQUIN) IV  750 mg Intravenous Q24H  . piperacillin-tazobactam (ZOSYN)  IV  3.375 g Intravenous Q8H  . vancomycin  1,500 mg Intravenous Once  . vancomycin  1,000 mg Intravenous Q12H   Infusions:  . sodium chloride Stopped (01/12/14 1655)  . sodium chloride 150 mL/hr at 01/12/14 1807   PRN: acetaminophen **OR** acetaminophen, alum & mag hydroxide-simeth, docusate sodium, guaiFENesin-dextromethorphan, HYDROcodone-acetaminophen, ondansetron, ondansetron (ZOFRAN) IV, promethazine   Assessment: 89yr male in relatively good health PTA, now with complicated GI sx's and  probable aspiration pneumonia to be tx'd with vancomycin and Zosyn  Goal of Therapy:  Desire vancomycin trough level to be 15-5120mcg/ml.  Will use standard Zosyn regimen since CrCl>3420ml/min  Plan:  1.  Vancomycin 1500mg  IV x 1 loading dose 2.  Vancomycin 1000mg  IV q12h maintenance regimen 3.  Zosyn 3.75gm IV q8h (4hr infusion) 4.  Monitor indices for infection and renal function 5.  Measure actual serum steady state vancomycin trough level as clinically indicated  Trypp Heckmann E 01/13/2014,6:48 AM

## 2014-01-14 ENCOUNTER — Inpatient Hospital Stay (HOSPITAL_COMMUNITY): Payer: Medicaid - Out of State

## 2014-01-14 LAB — URINE CULTURE: Colony Count: 30000

## 2014-01-14 LAB — LEGIONELLA ANTIGEN, URINE

## 2014-01-14 LAB — HIV ANTIBODY (ROUTINE TESTING W REFLEX): HIV: NONREACTIVE

## 2014-01-14 MED ORDER — PROMETHAZINE HCL 25 MG PO TABS: 25.0000 mg | ORAL_TABLET | Freq: Four times a day (QID) | ORAL | Status: DC | PRN

## 2014-01-14 MED ORDER — LORAZEPAM BOLUS VIA INFUSION: 1.0000 mg | INTRAVENOUS | Status: DC | PRN

## 2014-01-14 MED ORDER — LORAZEPAM 2 MG/ML IJ SOLN
1.0000 mg | INTRAMUSCULAR | Status: DC | PRN
  Administered 2014-01-14 (×2): 1 mg via INTRAVENOUS
  Filled 2014-01-14 (×2): qty 1

## 2014-01-14 NOTE — Progress Notes (Signed)
Subjective: He says he feels better. He is less dyspneic and in less distress. His heart rate has come down. He has no new complaints.  Objective: Vital signs in last 24 hours: Temp:  [97.6 F (36.4 C)-103.1 F (39.5 C)] 98.5 F (36.9 C) (12/01 0400) Pulse Rate:  [100-140] 114 (12/01 0700) Resp:  [17-44] 20 (12/01 0700) BP: (91-156)/(43-79) 110/77 mmHg (12/01 0700) SpO2:  [84 %-99 %] 97 % (12/01 0713) Weight:  [79.8 kg (175 lb 14.8 oz)] 79.8 kg (175 lb 14.8 oz) (12/01 0500) Weight change: -1.848 kg (-4 lb 1.2 oz) Last BM Date:  (pt unsure/"been a couple of days")  Intake/Output from previous day: 11/30 0701 - 12/01 0700 In: 4580 [I.V.:3530; IV Piggyback:1050] Out: 2495 [Urine:2495]  PHYSICAL EXAM General appearance: mild distress Resp: rhonchi bilaterally Cardio: regular rate and rhythm, S1, S2 normal, no murmur, click, rub or gallop GI: soft, non-tender; bowel sounds normal; no masses,  no organomegaly Extremities: extremities normal, atraumatic, no cyanosis or edema  Lab Results:  Results for orders placed or performed during the hospital encounter of 01/12/14 (from the past 48 hour(s))  CBC with Differential     Status: Abnormal   Collection Time: 01/12/14  1:27 PM  Result Value Ref Range   WBC 5.3 4.0 - 10.5 K/uL   RBC 5.23 4.22 - 5.81 MIL/uL   Hemoglobin 15.8 13.0 - 17.0 g/dL   HCT 45.1 39.0 - 52.0 %   MCV 86.2 78.0 - 100.0 fL   MCH 30.2 26.0 - 34.0 pg   MCHC 35.0 30.0 - 36.0 g/dL   RDW 12.8 11.5 - 15.5 %   Platelets 118 (Rodriguez) 150 - 400 K/uL   Neutrophils Relative % 87 (H) 43 - 77 %   Neutro Abs 4.6 1.7 - 7.7 K/uL   Lymphocytes Relative 9 (Rodriguez) 12 - 46 %   Lymphs Abs 0.5 (Rodriguez) 0.7 - 4.0 K/uL   Monocytes Relative 4 3 - 12 %   Monocytes Absolute 0.2 0.1 - 1.0 K/uL   Eosinophils Relative 0 0 - 5 %   Eosinophils Absolute 0.0 0.0 - 0.7 K/uL   Basophils Relative 0 0 - 1 %   Basophils Absolute 0.0 0.0 - 0.1 K/uL   Smear Review SPECIMEN CHECKED FOR CLOTS     Comment:  PLATELETS APPEAR DECREASED PLATELET COUNT CONFIRMED BY SMEAR   Comprehensive metabolic panel     Status: Abnormal   Collection Time: 01/12/14  1:27 PM  Result Value Ref Range   Sodium 134 (Rodriguez) 137 - 147 mEq/Rodriguez   Potassium 4.1 3.7 - 5.3 mEq/Rodriguez   Chloride 93 (Rodriguez) 96 - 112 mEq/Rodriguez   CO2 27 19 - 32 mEq/Rodriguez   Glucose, Bld 105 (H) 70 - 99 mg/dL   BUN 15 6 - 23 mg/dL   Creatinine, Ser 1.47 (H) 0.50 - 1.35 mg/dL   Calcium 8.6 8.4 - 10.5 mg/dL   Total Protein 7.3 6.0 - 8.3 g/dL   Albumin 3.3 (Rodriguez) 3.5 - 5.2 g/dL   AST 63 (H) 0 - 37 U/Rodriguez   ALT 50 0 - 53 U/Rodriguez   Alkaline Phosphatase 73 39 - 117 U/Rodriguez   Total Bilirubin 2.0 (H) 0.3 - 1.2 mg/dL   GFR calc non Af Amer 65 (Rodriguez) >90 mL/min   GFR calc Af Amer 76 (Rodriguez) >90 mL/min    Comment: (NOTE) The eGFR has been calculated using the CKD EPI equation. This calculation has not been validated in all clinical situations. eGFR's persistently <  90 mL/min signify possible Chronic Kidney Disease.    Anion gap 14 5 - 15  Lipase, blood     Status: None   Collection Time: 01/12/14  1:27 PM  Result Value Ref Range   Lipase 50 11 - 59 U/Rodriguez  Urinalysis, Routine w reflex microscopic     Status: Abnormal   Collection Time: 01/12/14  3:01 PM  Result Value Ref Range   Color, Urine AMBER (A) YELLOW    Comment: BIOCHEMICALS MAY BE AFFECTED BY COLOR   APPearance CLOUDY (A) CLEAR   Specific Gravity, Urine 1.025 1.005 - 1.030   pH 6.5 5.0 - 8.0   Glucose, UA 100 (A) NEGATIVE mg/dL   Hgb urine dipstick LARGE (A) NEGATIVE   Bilirubin Urine MODERATE (A) NEGATIVE   Ketones, ur 15 (A) NEGATIVE mg/dL   Protein, ur >300 (A) NEGATIVE mg/dL   Urobilinogen, UA >8.0 (H) 0.0 - 1.0 mg/dL   Nitrite NEGATIVE NEGATIVE   Leukocytes, UA NEGATIVE NEGATIVE  Urine microscopic-add on     Status: Abnormal   Collection Time: 01/12/14  3:01 PM  Result Value Ref Range   Squamous Epithelial / LPF RARE RARE   WBC, UA 7-10 <3 WBC/hpf   RBC / HPF 7-10 <3 RBC/hpf   Bacteria, UA MANY (A) RARE   Casts  GRANULAR CAST (A) NEGATIVE  Blood gas, arterial     Status: Abnormal   Collection Time: 01/13/14  3:30 AM  Result Value Ref Range   O2 Content 6.0 Rodriguez/min   Delivery systems NASAL CANNULA    pH, Arterial 7.382 7.350 - 7.450   pCO2 arterial 41.0 35.0 - 45.0 mmHg   pO2, Arterial 62.4 (Rodriguez) 80.0 - 100.0 mmHg   Bicarbonate 23.8 20.0 - 24.0 mEq/Rodriguez   TCO2 21.1 0 - 100 mmol/Rodriguez   Acid-base deficit 0.6 0.0 - 2.0 mmol/Rodriguez   O2 Saturation 91.8 %   Patient temperature 37.0    Collection site RIGHT BRACHIAL    Drawn by [id number]    Sample type ARTERIAL    Allens test (pass/fail) NOT INDICATED (A) PASS  Influenza panel by PCR (type A & B, H1N1)     Status: None   Collection Time: 01/13/14  4:04 AM  Result Value Ref Range   Influenza A By PCR NEGATIVE NEGATIVE   Influenza B By PCR NEGATIVE NEGATIVE   H1N1 flu by pcr NOT DETECTED NOT DETECTED    Comment:        The Xpert Flu assay (FDA approved for nasal aspirates or washes and nasopharyngeal swab specimens), is intended as an aid in the diagnosis of influenza and should not be used as a sole basis for treatment.   MRSA PCR Screening     Status: None   Collection Time: 01/13/14  6:16 AM  Result Value Ref Range   MRSA by PCR NEGATIVE NEGATIVE    Comment:        The GeneXpert MRSA Assay (FDA approved for NASAL specimens only), is one component of a comprehensive MRSA colonization surveillance program. It is not intended to diagnose MRSA infection nor to guide or monitor treatment for MRSA infections.   Strep pneumoniae urinary antigen     Status: None   Collection Time: 01/13/14  6:53 AM  Result Value Ref Range   Strep Pneumo Urinary Antigen NEGATIVE NEGATIVE    Comment:        Infection due to S. pneumoniae cannot be absolutely ruled out since the antigen present may be  below the detection limit of the test. Performed at [facility]   CBC     Status: Abnormal   Collection Time: 01/13/14  7:28 AM  Result Value Ref Range   WBC  4.9 4.0 - 10.5 K/uL   RBC 4.70 4.22 - 5.81 MIL/uL   Hemoglobin 14.2 13.0 - 17.0 g/dL   HCT 40.9 39.0 - 52.0 %   MCV 87.0 78.0 - 100.0 fL   MCH 30.2 26.0 - 34.0 pg   MCHC 34.7 30.0 - 36.0 g/dL   RDW 12.9 11.5 - 15.5 %   Platelets 100 (Rodriguez) 150 - 400 K/uL    Comment: SPECIMEN CHECKED FOR CLOTS PLATELET COUNT CONFIRMED BY SMEAR   Basic metabolic panel     Status: Abnormal   Collection Time: 01/13/14  7:28 AM  Result Value Ref Range   Sodium 138 137 - 147 mEq/Rodriguez   Potassium 4.3 3.7 - 5.3 mEq/Rodriguez   Chloride 100 96 - 112 mEq/Rodriguez   CO2 22 19 - 32 mEq/Rodriguez   Glucose, Bld 85 70 - 99 mg/dL   BUN 11 6 - 23 mg/dL   Creatinine, Ser 1.23 0.50 - 1.35 mg/dL   Calcium 7.5 (Rodriguez) 8.4 - 10.5 mg/dL   GFR calc non Af Amer 81 (Rodriguez) >90 mL/min   GFR calc Af Amer >90 >90 mL/min    Comment: (NOTE) The eGFR has been calculated using the CKD EPI equation. This calculation has not been validated in all clinical situations. eGFR's persistently <90 mL/min signify possible Chronic Kidney Disease.    Anion gap 16 (H) 5 - 15  Pro b natriuretic peptide     Status: Abnormal   Collection Time: 01/13/14  7:28 AM  Result Value Ref Range   Pro B Natriuretic peptide (BNP) 691.1 (H) 0 - 125 pg/mL  Lactic acid, plasma     Status: None   Collection Time: 01/13/14  7:28 AM  Result Value Ref Range   Lactic Acid, Venous 1.3 0.5 - 2.2 mmol/Rodriguez  CBC with Differential     Status: Abnormal   Collection Time: 01/13/14  7:28 AM  Result Value Ref Range   WBC 4.8 4.0 - 10.5 K/uL   RBC 4.72 4.22 - 5.81 MIL/uL   Hemoglobin 14.2 13.0 - 17.0 g/dL   HCT 41.1 39.0 - 52.0 %   MCV 87.1 78.0 - 100.0 fL   MCH 30.1 26.0 - 34.0 pg   MCHC 34.5 30.0 - 36.0 g/dL   RDW 12.9 11.5 - 15.5 %   Platelets 107 (Rodriguez) 150 - 400 K/uL    Comment: SPECIMEN CHECKED FOR CLOTS PLATELET COUNT CONFIRMED BY SMEAR    Neutrophils Relative % 81 (H) 43 - 77 %   Neutro Abs 3.9 1.7 - 7.7 K/uL   Lymphocytes Relative 13 12 - 46 %   Lymphs Abs 0.6 (Rodriguez) 0.7 - 4.0 K/uL    Monocytes Relative 6 3 - 12 %   Monocytes Absolute 0.3 0.1 - 1.0 K/uL   Eosinophils Relative 0 0 - 5 %   Eosinophils Absolute 0.0 0.0 - 0.7 K/uL   Basophils Relative 0 0 - 1 %   Basophils Absolute 0.0 0.0 - 0.1 K/uL  Comprehensive metabolic panel     Status: Abnormal   Collection Time: 01/13/14  7:28 AM  Result Value Ref Range   Sodium 139 137 - 147 mEq/Rodriguez   Potassium 4.2 3.7 - 5.3 mEq/Rodriguez   Chloride 101 96 - 112 mEq/Rodriguez  Comment: DELTA CHECK NOTED   CO2 24 19 - 32 mEq/Rodriguez   Glucose, Bld 96 70 - 99 mg/dL   BUN 10 6 - 23 mg/dL   Creatinine, Ser 1.21 0.50 - 1.35 mg/dL   Calcium 7.5 (Rodriguez) 8.4 - 10.5 mg/dL   Total Protein 5.7 (Rodriguez) 6.0 - 8.3 g/dL   Albumin 2.4 (Rodriguez) 3.5 - 5.2 g/dL   AST 52 (H) 0 - 37 U/Rodriguez   ALT 39 0 - 53 U/Rodriguez   Alkaline Phosphatase 65 39 - 117 U/Rodriguez   Total Bilirubin 1.2 0.3 - 1.2 mg/dL   GFR calc non Af Amer 83 (Rodriguez) >90 mL/min   GFR calc Af Amer >90 >90 mL/min    Comment: (NOTE) The eGFR has been calculated using the CKD EPI equation. This calculation has not been validated in all clinical situations. eGFR's persistently <90 mL/min signify possible Chronic Kidney Disease.    Anion gap 14 5 - 15  HIV antibody     Status: None   Collection Time: 01/13/14  7:28 AM  Result Value Ref Range   HIV 1&2 Ab, 4th Generation NONREACTIVE NONREACTIVE    Comment: (NOTE) A NONREACTIVE HIV Ag/Ab result does not exclude HIV infection since the time frame for seroconversion is variable. If acute HIV infection is suspected, a HIV-1 RNA Qualitative TMA test is recommended. HIV-1/2 Antibody Diff         Not indicated. HIV-1 RNA, Qual TMA           Not indicated. PLEASE NOTE: This information has been disclosed to you from records whose confidentiality may be protected by state law. If your state requires such protection, then the state law prohibits you from making any further disclosure of the information without the specific written consent of the person to whom it pertains, or as otherwise  permitted by law. A general authorization for the release of medical or other information is NOT sufficient for this purpose. The performance of this assay has not been clinically validated in patients less than 71 years old. Performed at [facility]   Phosphorus     Status: None   Collection Time: 01/13/14  7:28 AM  Result Value Ref Range   Phosphorus 2.6 2.3 - 4.6 mg/dL  Culture, expectorated sputum-assessment     Status: None   Collection Time: 01/13/14 10:00 AM  Result Value Ref Range   Specimen Description SPUTUM EXPECTORATED    Special Requests NONE    Sputum evaluation      THIS SPECIMEN IS ACCEPTABLE. RESPIRATORY CULTURE REPORT TO FOLLOW. Performed at [facility]    Report Status 01/13/2014 FINAL   Culture, blood (routine x 2)     Status: None (Preliminary result)   Collection Time: 01/13/14 10:03 AM  Result Value Ref Range   Specimen Description BLOOD    Special Requests Normal    Culture NO GROWTH <24 HRS    Report Status PENDING   Culture, blood (routine x 2)     Status: None (Preliminary result)   Collection Time: 01/13/14 10:17 AM  Result Value Ref Range   Specimen Description BLOOD    Special Requests Normal    Culture NO GROWTH <24 HRS    Report Status PENDING   Blood gas, arterial     Status: Abnormal   Collection Time: 01/13/14  3:49 PM  Result Value Ref Range   FIO2 100.00 %   Delivery systems OXYGEN MASK    pH, Arterial 7.415 7.350 -  7.450   pCO2 arterial 36.9 35.0 - 45.0 mmHg   pO2, Arterial 64.7 (Rodriguez) 80.0 - 100.0 mmHg   Bicarbonate 23.2 20.0 - 24.0 mEq/Rodriguez   TCO2 20.6 0 - 100 mmol/Rodriguez   Acid-base deficit 0.8 0.0 - 2.0 mmol/Rodriguez   O2 Saturation 92.8 %   Patient temperature 37.0    Collection site LEFT RADIAL    Drawn by COLLECTED BY RT    Sample type ARTERIAL    Allens test (pass/fail) PASS PASS    ABGS  Recent Labs  01/13/14 1549  PHART 7.415  PO2ART 64.7*  TCO2 20.6  HCO3 23.2   CULTURES Recent Results (from the past 240  hour(s))  MRSA PCR Screening     Status: None   Collection Time: 01/13/14  6:16 AM  Result Value Ref Range Status   MRSA by PCR NEGATIVE NEGATIVE Final    Comment:        The GeneXpert MRSA Assay (FDA approved for NASAL specimens only), is one component of a comprehensive MRSA colonization surveillance program. It is not intended to diagnose MRSA infection nor to guide or monitor treatment for MRSA infections.   Culture, expectorated sputum-assessment     Status: None   Collection Time: 01/13/14 10:00 AM  Result Value Ref Range Status   Specimen Description SPUTUM EXPECTORATED  Final   Special Requests NONE  Final   Sputum evaluation   Final    THIS SPECIMEN IS ACCEPTABLE. RESPIRATORY CULTURE REPORT TO FOLLOW. Performed at [facility]    Report Status 01/13/2014 FINAL  Final  Culture, blood (routine x 2)     Status: None (Preliminary result)   Collection Time: 01/13/14 10:03 AM  Result Value Ref Range Status   Specimen Description BLOOD  Final   Special Requests Normal  Final   Culture NO GROWTH <24 HRS  Final   Report Status PENDING  Incomplete  Culture, blood (routine x 2)     Status: None (Preliminary result)   Collection Time: 01/13/14 10:17 AM  Result Value Ref Range Status   Specimen Description BLOOD  Final   Special Requests Normal  Final   Culture NO GROWTH <24 HRS  Final   Report Status PENDING  Incomplete   Studies/Results: Dg Chest 2 View  01/12/2014   CLINICAL DATA:  Productive cough.  EXAM: CHEST  2 VIEW  COMPARISON:  January 09, 2014.  FINDINGS: The heart size and mediastinal contours are within normal limits. No pneumothorax or pleural effusion is noted. Interval development of large airspace opacity seen in left lower lobe consistent with pneumonia. New right perihilar opacity is noted also consistent with pneumonia. The visualized skeletal structures are unremarkable.  IMPRESSION: New left lower lobe and right perihilar pneumonia. Follow-up  radiographs are recommended to ensure resolution.   Electronically Signed   By: Sabino Dick M.D.   On: 01/12/2014 14:11   Ct Chest Wo Contrast  01/13/2014   CLINICAL DATA:  Fever, chills, cough, dehydration. Community acquired pneumonia.  EXAM: CT CHEST WITHOUT CONTRAST  TECHNIQUE: Multidetector CT imaging of the chest was performed following the standard protocol without IV contrast.  COMPARISON:  PA and lateral chest 01/09/2014 and 01/12/2014. Single view of the chest 01/13/2014.  FINDINGS: Trace right pleural effusion is identified. The patient has a small left pleural effusion. There is no pericardial effusion. Heart size is normal. There is no axillary, hilar or mediastinal lymphadenopathy. Extensive airspace disease is present in the left chest, worst in left lower  lobe. Airspace disease is also seen in the right upper lobe. There is minimal dependent atelectasis in the right lung base.  Incidentally imaged upper abdomen is unremarkable. No focal bony abnormality is identified.  IMPRESSION: Airspace disease in the right upper lobe and throughout the left chest is most consistent with multifocal pneumonia. Associated small bilateral pleural effusions, greater on the left, are identified.   Electronically Signed   By: Inge Rise M.D.   On: 01/13/2014 08:52   Dg Chest Port 1 View  01/13/2014   CLINICAL DATA:  Acute onset of cough, fever and vomiting for 6 days. Decreased O2 saturation. Acute respiratory failure. Subsequent encounter.  EXAM: PORTABLE CHEST - 1 VIEW  COMPARISON:  Chest radiograph performed 01/12/2014  FINDINGS: There is significantly worsened left-sided airspace opacification, with mild sparing at the left lung apex. Mildly worsened right perihilar airspace opacity is also seen. Given the appearance, this is suspicious for severe multifocal pneumonia. A small left pleural effusion may be present. Underlying pulmonary edema cannot be excluded. No pneumothorax is seen.  The  cardiomediastinal silhouette is not well assessed due to the opacification of the left hemithorax. No acute osseous abnormalities are identified.  IMPRESSION: Significantly worsened left-sided airspace opacification. Mildly worsened right perihilar airspace opacity also seen. Given the appearance, this is suspicious for worsening severe multifocal pneumonia. Question of small left pleural effusion. Underlying pulmonary edema cannot be excluded.   Electronically Signed   By: Garald Balding M.D.   On: 01/13/2014 03:46    Medications:  Prior to Admission:  Prescriptions prior to admission  Medication Sig Dispense Refill Last Dose  . ondansetron (ZOFRAN) 4 MG tablet Take 4 mg by mouth every 8 (eight) hours as needed for nausea or vomiting.   01/12/2014 at Unknown time   Scheduled: . antiseptic oral rinse  7 mL Mouth Rinse q12n4p  . chlorhexidine  15 mL Mouth Rinse BID  . guaiFENesin  1,200 mg Oral BID  . levalbuterol  0.63 mg Nebulization Q6H WA  . levofloxacin (LEVAQUIN) IV  750 mg Intravenous Q24H  . piperacillin-tazobactam (ZOSYN)  IV  3.375 g Intravenous Q8H  . vancomycin  1,000 mg Intravenous Q12H   Continuous: . sodium chloride Stopped (01/12/14 1655)  . sodium chloride 150 mL/hr at 01/14/14 0701   FVC:[id number] **OR** acetaminophen, alum & mag hydroxide-simeth, docusate sodium, guaiFENesin-dextromethorphan, HYDROcodone-acetaminophen, ondansetron, ondansetron (ZOFRAN) IV, promethazine  Assesment: He was admitted with acute hypoxic respiratory failure and is still requiring nonrebreather mask but he looks significantly better. He has community-acquired pneumonia and is being treated for that. He has sepsis and I think that is improving. He also has been dehydrated with acute renal insufficiency and all of that seems to be improving Active Problems:   Acute respiratory failure with hypoxia   Acute renal insufficiency   Dehydration   CAP (community acquired pneumonia)   UTI (lower  urinary tract infection)   Sepsis    Plan: Because of his severe illness he is being treated with broad-spectrum antibiotics which I think is appropriate. He has an extensive infiltrate on the left lung. He has improved.    LOS: 2 days   Omar Rodriguez 01/14/2014, 8:01 AM

## 2014-01-14 NOTE — Plan of Care (Signed)
Problem: Phase II Progression Outcomes Goal: Encourage coughing & deep breathing Outcome: Completed/Met Date Met:  01/14/14 Goal: Wean O2 if indicated Outcome: Progressing Goal: Pain controlled Outcome: Progressing Goal: Review culture results with MD if needed Outcome: Completed/Met Date Met:  01/14/14 Goal: Progress activity as tolerated unless otherwise ordered Outcome: Progressing Goal: Discharge plan established Outcome: Completed/Met Date Met:  01/14/14 Goal: Tolerating diet Outcome: Progressing Goal: If HF, initiate HF Core Reminder Form Outcome: Progressing Goal: Other Phase II Outcomes/Goals Outcome: Progressing

## 2014-01-14 NOTE — Progress Notes (Signed)
Nutrition Brief Note  Patient identified on the Malnutrition Screening Tool (MST) Report  Wt Readings from Last 15 Encounters:  01/14/14 175 lb 14.8 oz (79.8 kg)    Body mass index is 26.76 kg/(m^2). Patient meets criteria for overweight based on current BMI.   Current diet order is regular, patient is consuming approximately 100% of meals at this time. Labs and medications reviewed.   No nutrition interventions warranted at this time. If nutrition issues arise, please consult RD.   Sascha Baugher A. Mayford KnifeWilliams, RD, LDN Pager: [phone]

## 2014-01-14 NOTE — Care Management Note (Signed)
    Page 1 of 1   01/14/2014     3:45:33 PM CARE MANAGEMENT NOTE 01/14/2014  Patient:  Omar Rodriguez,Omar Rodriguez   Account Number:  [id number]  Date Initiated:  01/14/2014  Documentation initiated by:  Kathyrn SheriffHILDRESS,JESSICA  Subjective/Objective Assessment:   Pt is from home, admitted with PNA.     Action/Plan:   Plan for discharge home with self care. No CM needs identified at this time. Will continue to follow for CM needs.   Anticipated DC Date:  01/17/2014   Anticipated DC Plan:  HOME/SELF CARE      DC Planning Services  CM consult      Choice offered to / List presented to:             Status of service:  In process, will continue to follow Medicare Important Message given?   (If response is "NO", the following Medicare IM given date fields will be blank) Date Medicare IM given:   Medicare IM given by:   Date Additional Medicare IM given:   Additional Medicare IM given by:    Discharge Disposition:  HOME/SELF CARE  Per UR Regulation:    If discussed at Long Length of Stay Meetings, dates discussed:    Comments:  01/14/2014 1500 Kathyrn SheriffJessica Childress, RN, MSN, [facility]N

## 2014-01-14 NOTE — Progress Notes (Signed)
[facility] PROGRESS NOTE  Jonna Coupony C Hribar EAV:[id number] DOB: 01/03/1991 DOA: 01/12/2014 PCP: No PCP Per Patient  Brief Narrative: 23 year old presenting with sepsis secondary to multifocal pneumonia. Admitted to step down unit and pulmonology on board.  Assessment/Plan:   Sepsis: present on admission. - Will continue to monitor blood pressures. - Will continue broad spectrum antibiotics - obtain sputum culture - blood cultures not obtained initially. Will order but will keeping in mind that patient has already had antibiotics as such may affect results.  Acute respiratory failure with hypoxia - Most likely due to multifocal pneumonia - continue supplemental oxygen    Acute renal insufficiency - Suspect most likely 2ary to prerenal causes. - Improving with IVF rehydration - Will continue IVF's    Dehydration - Improved with IVF rehydration. - As patient continues to improve will have nursing encourage po intake    CAP (community acquired pneumonia) - Continue broad spectrum antibiotics until sputum cultures resulted. - Sputum culture obtained and pending.  Code Status: full Family Communication: discussed with patient and family at bedside  Disposition Plan: Pending improvement in condition   Consultants:  None  Procedures:  None  Antibiotics:  Levaquin  Vancomycin  Zosyn  HPI/Subjective: Pt has no new complaints. No acute issues reported overnight. Family reports improvement in condition today.  Objective: Filed Vitals:   01/14/14 0730  BP:   Pulse:   Temp: 98 F (36.7 C)  Resp:     Intake/Output Summary (Last 24 hours) at 01/14/14 1019 Last data filed at 01/14/14 0600  Gross per 24 hour  Intake   4530 ml  Output   2220 ml  Net   2310 ml   Filed Weights   01/12/14 1709 01/13/14 0611 01/14/14 0500  Weight: 76.295 kg (168 lb 3.2 oz) 76.1 kg (167 lb 12.3 oz) 79.8 kg (175 lb 14.8 oz)    Exam:   General:  Pt in nad, alert and  awake  Cardiovascular: rrr, no mrg  Respiratory: +Rhales, no wheezes, speaking in full sentences  Abdomen: soft, Nd, nt   Musculoskeletal: no cyanosis or clubbing on limited exam   Data Reviewed: Basic Metabolic Panel:  Recent Labs Lab 01/12/14 1327 01/13/14 0728  NA 134* 138  139  K 4.1 4.3  4.2  CL 93* 100  101  CO2 27 22  24   GLUCOSE 105* 85  96  BUN 15 11  10   CREATININE 1.47* 1.23  1.21  CALCIUM 8.6 7.5*  7.5*  PHOS  --  2.6   Liver Function Tests:  Recent Labs Lab 01/12/14 1327 01/13/14 0728  AST 63* 52*  ALT 50 39  ALKPHOS 73 65  BILITOT 2.0* 1.2  PROT 7.3 5.7*  ALBUMIN 3.3* 2.4*    Recent Labs Lab 01/12/14 1327  LIPASE 50   No results for input(s): AMMONIA in the last 168 hours. CBC:  Recent Labs Lab 01/12/14 1327 01/13/14 0728  WBC 5.3 4.8  4.9  NEUTROABS 4.6 3.9  HGB 15.8 14.2  14.2  HCT 45.1 41.1  40.9  MCV 86.2 87.1  87.0  PLT 118* 107*  100*   Cardiac Enzymes: No results for input(s): CKTOTAL, CKMB, CKMBINDEX, TROPONINI in the last 168 hours. BNP (last 3 results)  Recent Labs  01/13/14 0728  PROBNP 691.1*   CBG: No results for input(s): GLUCAP in the last 168 hours.  Recent Results (from the past 240 hour(s))  Urine culture     Status: None   Collection Time:  01/12/14  3:01 PM  Result Value Ref Range Status   Specimen Description URINE, CLEAN CATCH  Final   Special Requests NONE  Final   Culture  Setup Time   Final    01/12/2014 21:38 Performed at [facility]    Colony Count   Final    30,000 COLONIES/ML Performed at [facility]    Culture   Final    Multiple bacterial morphotypes present, none predominant. Suggest appropriate recollection if clinically indicated. Performed at [facility]    Report Status 01/14/2014 FINAL  Final  MRSA PCR Screening     Status: None   Collection Time: 01/13/14  6:16 AM  Result Value Ref Range Status   MRSA by PCR NEGATIVE NEGATIVE Final     Comment:        The GeneXpert MRSA Assay (FDA approved for NASAL specimens only), is one component of a comprehensive MRSA colonization surveillance program. It is not intended to diagnose MRSA infection nor to guide or monitor treatment for MRSA infections.   Culture, expectorated sputum-assessment     Status: None   Collection Time: 01/13/14 10:00 AM  Result Value Ref Range Status   Specimen Description SPUTUM EXPECTORATED  Final   Special Requests NONE  Final   Sputum evaluation   Final    THIS SPECIMEN IS ACCEPTABLE. RESPIRATORY CULTURE REPORT TO FOLLOW. Performed at [facility]    Report Status 01/13/2014 FINAL  Final  Culture, respiratory (NON-Expectorated)     Status: None (Preliminary result)   Collection Time: 01/13/14 10:00 AM  Result Value Ref Range Status   Specimen Description SPUTUM EXPECTORATED  Final   Special Requests NONE  Final   Gram Stain PENDING  Incomplete   Culture   Final    Culture reincubated for better growth Performed at [facility]    Report Status PENDING  Incomplete  Culture, blood (routine x 2)     Status: None (Preliminary result)   Collection Time: 01/13/14 10:03 AM  Result Value Ref Range Status   Specimen Description BLOOD  Final   Special Requests Normal  Final   Culture NO GROWTH <24 HRS  Final   Report Status PENDING  Incomplete  Culture, blood (routine x 2)     Status: None (Preliminary result)   Collection Time: 01/13/14 10:17 AM  Result Value Ref Range Status   Specimen Description BLOOD  Final   Special Requests Normal  Final   Culture NO GROWTH <24 HRS  Final   Report Status PENDING  Incomplete     Studies: Dg Chest 2 View  01/12/2014   CLINICAL DATA:  Productive cough.  EXAM: CHEST  2 VIEW  COMPARISON:  January 09, 2014.  FINDINGS: The heart size and mediastinal contours are within normal limits. No pneumothorax or pleural effusion is noted. Interval development of large airspace opacity seen in left  lower lobe consistent with pneumonia. New right perihilar opacity is noted also consistent with pneumonia. The visualized skeletal structures are unremarkable.  IMPRESSION: New left lower lobe and right perihilar pneumonia. Follow-up radiographs are recommended to ensure resolution.   Electronically Signed   By: Roque LiasJames  Green M.D.   On: 01/12/2014 14:11   Ct Chest Wo Contrast  01/13/2014   CLINICAL DATA:  Fever, chills, cough, dehydration. Community acquired pneumonia.  EXAM: CT CHEST WITHOUT CONTRAST  TECHNIQUE: Multidetector CT imaging of the chest was performed following the standard protocol without IV contrast.  COMPARISON:  PA  and lateral chest 01/09/2014 and 01/12/2014. Single view of the chest 01/13/2014.  FINDINGS: Trace right pleural effusion is identified. The patient has a small left pleural effusion. There is no pericardial effusion. Heart size is normal. There is no axillary, hilar or mediastinal lymphadenopathy. Extensive airspace disease is present in the left chest, worst in left lower lobe. Airspace disease is also seen in the right upper lobe. There is minimal dependent atelectasis in the right lung base.  Incidentally imaged upper abdomen is unremarkable. No focal bony abnormality is identified.  IMPRESSION: Airspace disease in the right upper lobe and throughout the left chest is most consistent with multifocal pneumonia. Associated small bilateral pleural effusions, greater on the left, are identified.   Electronically Signed   By: Drusilla Kanner M.D.   On: 01/13/2014 08:52   Dg Chest Port 1 View  01/13/2014   CLINICAL DATA:  Acute onset of cough, fever and vomiting for 6 days. Decreased O2 saturation. Acute respiratory failure. Subsequent encounter.  EXAM: PORTABLE CHEST - 1 VIEW  COMPARISON:  Chest radiograph performed 01/12/2014  FINDINGS: There is significantly worsened left-sided airspace opacification, with mild sparing at the left lung apex. Mildly worsened right perihilar  airspace opacity is also seen. Given the appearance, this is suspicious for severe multifocal pneumonia. A small left pleural effusion may be present. Underlying pulmonary edema cannot be excluded. No pneumothorax is seen.  The cardiomediastinal silhouette is not well assessed due to the opacification of the left hemithorax. No acute osseous abnormalities are identified.  IMPRESSION: Significantly worsened left-sided airspace opacification. Mildly worsened right perihilar airspace opacity also seen. Given the appearance, this is suspicious for worsening severe multifocal pneumonia. Question of small left pleural effusion. Underlying pulmonary edema cannot be excluded.   Electronically Signed   By: Roanna Raider M.D.   On: 01/13/2014 03:46    Scheduled Meds: . antiseptic oral rinse  7 mL Mouth Rinse q12n4p  . chlorhexidine  15 mL Mouth Rinse BID  . guaiFENesin  1,200 mg Oral BID  . levalbuterol  0.63 mg Nebulization Q6H WA  . levofloxacin (LEVAQUIN) IV  750 mg Intravenous Q24H  . piperacillin-tazobactam (ZOSYN)  IV  3.375 g Intravenous Q8H  . vancomycin  1,000 mg Intravenous Q12H   Continuous Infusions: . sodium chloride Stopped (01/12/14 1655)  . sodium chloride 150 mL/hr at 01/14/14 0846    Active Problems:   Time spent: > 35 minutes    Penny Pia  [facility] Pager [phone]. If 7PM-7AM, please contact night-coverage at [email or web address], password [facility] 01/14/2014, 10:19 AM  LOS: 2 days

## 2014-01-14 NOTE — Progress Notes (Signed)
Pt has been on a NRB since yesterday. This morning RT came in and helped Pt in a chair and ordered an incentive . Pt was cooperative but complained about being very sore and weak. Pt ;s O2 SATS decreased to 82%-86% with 100% NRB. RT called Dr. Juanetta GoslingHawkins and asked if Pt could be put on a BIPAP. Placed Pt on BIPAP 10/5 and 80% O2. Dr. Juanetta GoslingHawkins also ordered a chest XRay.

## 2014-01-15 ENCOUNTER — Inpatient Hospital Stay (HOSPITAL_COMMUNITY): Payer: Medicaid - Out of State

## 2014-01-15 DIAGNOSIS — J9600 Acute respiratory failure, unspecified whether with hypoxia or hypercapnia: Secondary | ICD-10-CM | POA: Insufficient documentation

## 2014-01-15 LAB — POCT I-STAT 3, ART BLOOD GAS (G3+)
ACID-BASE EXCESS: 4 mmol/L — AB (ref 0.0–2.0)
BICARBONATE: 28.4 meq/L — AB (ref 20.0–24.0)
O2 Saturation: 98 %
Patient temperature: 99.4
TCO2: 30 mmol/L (ref 0–100)
pCO2 arterial: 43.4 mmHg (ref 35.0–45.0)
pH, Arterial: 7.425 (ref 7.350–7.450)
pO2, Arterial: 98 mmHg (ref 80.0–100.0)

## 2014-01-15 LAB — BASIC METABOLIC PANEL
ANION GAP: 11 (ref 5–15)
ANION GAP: 12 (ref 5–15)
BUN: 9 mg/dL (ref 6–23)
BUN: 13 mg/dL (ref 6–23)
CO2: 26 mEq/L (ref 19–32)
CO2: 27 mEq/L (ref 19–32)
CREATININE: 0.99 mg/dL (ref 0.50–1.35)
Calcium: 7.5 mg/dL — ABNORMAL LOW (ref 8.4–10.5)
Calcium: 7.8 mg/dL — ABNORMAL LOW (ref 8.4–10.5)
Chloride: 97 mEq/L (ref 96–112)
Chloride: 95 mEq/L — ABNORMAL LOW (ref 96–112)
Creatinine, Ser: 1.34 mg/dL (ref 0.50–1.35)
GFR calc Af Amer: >90 mL/min (ref >90)
GFR calc Af Amer: 85 mL/min — ABNORMAL LOW (ref >90)
GFR, EST NON AFRICAN AMERICAN: 73 mL/min — AB (ref >90)
Glucose, Bld: 135 mg/dL — ABNORMAL HIGH (ref 70–99)
Glucose, Bld: 106 mg/dL — ABNORMAL HIGH (ref 70–99)
Potassium: 4 mEq/L (ref 3.7–5.3)
Potassium: 4.5 mEq/L (ref 3.7–5.3)
SODIUM: 134 meq/L — AB (ref 137–147)
Sodium: 134 mEq/L — ABNORMAL LOW (ref 137–147)

## 2014-01-15 LAB — CBC WITH DIFFERENTIAL/PLATELET
BASOS ABS: 0 10*3/uL (ref 0.0–0.1)
Basophils Relative: 0 % (ref 0–1)
Eosinophils Absolute: 0.1 10*3/uL (ref 0.0–0.7)
Eosinophils Relative: 1 % (ref 0–5)
HEMATOCRIT: 36.9 % — AB (ref 39.0–52.0)
Hemoglobin: 12.8 g/dL — ABNORMAL LOW (ref 13.0–17.0)
LYMPHS PCT: 13 % (ref 12–46)
Lymphs Abs: 0.6 10*3/uL — ABNORMAL LOW (ref 0.7–4.0)
MCH: 30.1 pg (ref 26.0–34.0)
MCHC: 34.7 g/dL (ref 30.0–36.0)
MCV: 86.8 fL (ref 78.0–100.0)
Monocytes Absolute: 0.4 10*3/uL (ref 0.1–1.0)
Monocytes Relative: 9 % (ref 3–12)
Neutro Abs: 3.8 10*3/uL (ref 1.7–7.7)
Neutrophils Relative %: 77 % (ref 43–77)
PLATELETS: 147 10*3/uL — AB (ref 150–400)
RBC: 4.25 MIL/uL (ref 4.22–5.81)
RDW: 13.1 % (ref 11.5–15.5)
WBC: 4.9 10*3/uL (ref 4.0–10.5)

## 2014-01-15 LAB — URINE MICROSCOPIC-ADD ON

## 2014-01-15 LAB — BLOOD GAS, ARTERIAL
ACID-BASE EXCESS: 3.3 mmol/L — AB (ref 0.0–2.0)
Bicarbonate: 27.6 mEq/L — ABNORMAL HIGH (ref 20.0–24.0)
FIO2: 100 %
O2 Saturation: 96.3 %
PATIENT TEMPERATURE: 37
TCO2: 24.5 mmol/L (ref 0–100)
pCO2 arterial: 44.5 mmHg (ref 35.0–45.0)
pH, Arterial: 7.409 (ref 7.350–7.450)
pO2, Arterial: 84.4 mmHg (ref 80.0–100.0)

## 2014-01-15 LAB — CULTURE, RESPIRATORY W GRAM STAIN

## 2014-01-15 LAB — URINALYSIS, ROUTINE W REFLEX MICROSCOPIC
BILIRUBIN URINE: NEGATIVE
Glucose, UA: NEGATIVE mg/dL
KETONES UR: NEGATIVE mg/dL
Leukocytes, UA: NEGATIVE
NITRITE: NEGATIVE
Protein, ur: 100 mg/dL — AB
Specific Gravity, Urine: 1.018 (ref 1.005–1.030)
Urobilinogen, UA: 2 mg/dL — ABNORMAL HIGH (ref 0.0–1.0)
pH: 6.5 (ref 5.0–8.0)

## 2014-01-15 LAB — CBC
HCT: 36.5 % — ABNORMAL LOW (ref 39.0–52.0)
Hemoglobin: 12.3 g/dL — ABNORMAL LOW (ref 13.0–17.0)
MCH: 29.9 pg (ref 26.0–34.0)
MCHC: 33.7 g/dL (ref 30.0–36.0)
MCV: 88.8 fL (ref 78.0–100.0)
PLATELETS: 156 10*3/uL (ref 150–400)
RBC: 4.11 MIL/uL — ABNORMAL LOW (ref 4.22–5.81)
RDW: 13.4 % (ref 11.5–15.5)
WBC: 4.9 10*3/uL (ref 4.0–10.5)

## 2014-01-15 LAB — VANCOMYCIN, TROUGH

## 2014-01-15 LAB — PROCALCITONIN: Procalcitonin: 0.93 ng/mL

## 2014-01-15 LAB — GLUCOSE, CAPILLARY
GLUCOSE-CAPILLARY: 89 mg/dL (ref 70–99)
Glucose-Capillary: 90 mg/dL (ref 70–99)
Glucose-Capillary: 111 mg/dL — ABNORMAL HIGH (ref 70–99)

## 2014-01-15 LAB — LACTIC ACID, PLASMA: LACTIC ACID, VENOUS: 1 mmol/L (ref 0.5–2.2)

## 2014-01-15 LAB — CULTURE, RESPIRATORY: CULTURE: NORMAL

## 2014-01-15 MED ORDER — SIMETHICONE 40 MG/0.6ML PO SUSP
ORAL | Status: AC
  Filled 2014-01-15: qty 0.6

## 2014-01-15 MED ORDER — FENTANYL CITRATE 0.05 MG/ML IJ SOLN
100.0000 ug | Freq: Once | INTRAMUSCULAR | Status: AC
  Administered 2014-01-15: 100 ug via INTRAVENOUS

## 2014-01-15 MED ORDER — CHLORHEXIDINE GLUCONATE 0.12 % MT SOLN
15.0000 mL | Freq: Two times a day (BID) | OROMUCOSAL | Status: DC
  Administered 2014-01-15 – 2014-01-18 (×6): 15 mL via OROMUCOSAL
  Filled 2014-01-15 (×6): qty 15

## 2014-01-15 MED ORDER — FENTANYL CITRATE 0.05 MG/ML IJ SOLN
INTRAMUSCULAR | Status: AC
  Filled 2014-01-15: qty 4

## 2014-01-15 MED ORDER — PANTOPRAZOLE SODIUM 40 MG IV SOLR
40.0000 mg | Freq: Every day | INTRAVENOUS | Status: DC
  Administered 2014-01-15: 40 mg via INTRAVENOUS
  Filled 2014-01-15 (×2): qty 40

## 2014-01-15 MED ORDER — VITAL AF 1.2 CAL PO LIQD
1000.0000 mL | ORAL | Status: DC
  Administered 2014-01-15: 1000 mL
  Filled 2014-01-15 (×7): qty 1000

## 2014-01-15 MED ORDER — FENTANYL CITRATE 0.05 MG/ML IJ SOLN
50.0000 ug | Freq: Once | INTRAMUSCULAR | Status: AC
  Administered 2014-01-15: 50 ug via INTRAVENOUS

## 2014-01-15 MED ORDER — FENTANYL CITRATE 0.05 MG/ML IJ SOLN
100.0000 ug | INTRAMUSCULAR | Status: DC | PRN
  Administered 2014-01-15 (×2): 100 ug via INTRAVENOUS
  Filled 2014-01-15: qty 2

## 2014-01-15 MED ORDER — VANCOMYCIN HCL IN DEXTROSE 1-5 GM/200ML-% IV SOLN
1000.0000 mg | Freq: Two times a day (BID) | INTRAVENOUS | Status: DC
  Administered 2014-01-15: 1000 mg via INTRAVENOUS
  Filled 2014-01-15 (×2): qty 200

## 2014-01-15 MED ORDER — HEPARIN SODIUM (PORCINE) 5000 UNIT/ML IJ SOLN
5000.0000 [IU] | Freq: Three times a day (TID) | INTRAMUSCULAR | Status: DC
  Administered 2014-01-15 – 2014-01-16 (×3): 5000 [IU] via SUBCUTANEOUS
  Filled 2014-01-15 (×5): qty 1

## 2014-01-15 MED ORDER — MIDAZOLAM HCL 2 MG/2ML IJ SOLN
INTRAMUSCULAR | Status: AC
  Filled 2014-01-15: qty 4

## 2014-01-15 MED ORDER — SODIUM CHLORIDE 0.9 % IV SOLN
25.0000 ug/h | INTRAVENOUS | Status: DC
  Administered 2014-01-15: 100 ug/h via INTRAVENOUS
  Administered 2014-01-16: 50 ug/h via INTRAVENOUS
  Administered 2014-01-17 – 2014-01-18 (×2): 150 ug/h via INTRAVENOUS
  Filled 2014-01-15 (×4): qty 50

## 2014-01-15 MED ORDER — PRO-STAT SUGAR FREE PO LIQD
30.0000 mL | Freq: Two times a day (BID) | ORAL | Status: AC
  Administered 2014-01-15 – 2014-01-16 (×2): 30 mL
  Filled 2014-01-15 (×2): qty 30

## 2014-01-15 MED ORDER — ROCURONIUM BROMIDE 50 MG/5ML IV SOLN
70.0000 mg | Freq: Once | INTRAVENOUS | Status: AC
  Administered 2014-01-15: 70 mg via INTRAVENOUS
  Filled 2014-01-15: qty 7

## 2014-01-15 MED ORDER — CETYLPYRIDINIUM CHLORIDE 0.05 % MT LIQD
7.0000 mL | Freq: Four times a day (QID) | OROMUCOSAL | Status: DC
  Administered 2014-01-15 – 2014-01-19 (×13): 7 mL via OROMUCOSAL

## 2014-01-15 MED ORDER — VANCOMYCIN HCL 10 G IV SOLR
1250.0000 mg | Freq: Three times a day (TID) | INTRAVENOUS | Status: DC
  Administered 2014-01-15 – 2014-01-16 (×2): 1250 mg via INTRAVENOUS
  Filled 2014-01-15 (×4): qty 1250

## 2014-01-15 MED ORDER — FENTANYL CITRATE 0.05 MG/ML IJ SOLN
100.0000 ug | INTRAMUSCULAR | Status: DC | PRN
  Administered 2014-01-16: 100 ug via INTRAVENOUS
  Administered 2014-01-18: 50 ug via INTRAVENOUS
  Filled 2014-01-15: qty 2

## 2014-01-15 MED ORDER — MIDAZOLAM HCL 2 MG/2ML IJ SOLN
2.0000 mg | Freq: Once | INTRAMUSCULAR | Status: AC
  Administered 2014-01-15: 2 mg via INTRAVENOUS

## 2014-01-15 MED ORDER — SODIUM CHLORIDE 0.9 % IV SOLN
500.0000 mg | Freq: Once | INTRAVENOUS | Status: AC
  Administered 2014-01-15: 500 mg via INTRAVENOUS
  Filled 2014-01-15: qty 500

## 2014-01-15 MED ORDER — ETOMIDATE 2 MG/ML IV SOLN
20.0000 mg | Freq: Once | INTRAVENOUS | Status: AC
  Administered 2014-01-15: 20 mg via INTRAVENOUS

## 2014-01-15 MED ORDER — FENTANYL BOLUS VIA INFUSION
50.0000 ug | INTRAVENOUS | Status: DC | PRN
  Filled 2014-01-15: qty 50

## 2014-01-15 NOTE — Progress Notes (Signed)
Called report to receiving RN on 14M at [facility].  Verbalized understanding.  Pt transferred to facility via [facility]. Schonewitz, Candelaria StagersLeigh Anne 01/15/2014

## 2014-01-15 NOTE — Progress Notes (Signed)
Subjective: He continues to have difficulty. He tolerated BiPAP fairly well yesterday. He is still on a nonrebreather mask and his PO2 is only in the 60s. Chest x-ray yesterday was worse chest x-ray this morning is pending  Objective: Vital signs in last 24 hours: Temp:  [97.7 F (36.5 C)-102.8 F (39.3 C)] 97.8 F (36.6 C) (12/02 0822) Pulse Rate:  [102-141] 114 (12/02 0800) Resp:  [12-43] 16 (12/02 0800) BP: (100-138)/(36-93) 121/64 mmHg (12/02 0800) SpO2:  [85 %-99 %] 95 % (12/02 0800) FiO2 (%):  [50 %-70 %] 55 % (12/02 0453) Weight:  [80.1 kg (176 lb 9.4 oz)] 80.1 kg (176 lb 9.4 oz) (12/02 0400) Weight change: 0.3 kg (10.6 oz) Last BM Date:  (pt unsure/"been a couple of days")  Intake/Output from previous day: 12/01 0701 - 12/02 0700 In: 4450 [I.V.:3750; IV Piggyback:700] Out: 3860 [Urine:3860]  PHYSICAL EXAM General appearance: alert and moderate distress Resp: rhonchi bilaterally Cardio: regular rate and rhythm, S1, S2 normal, no murmur, click, rub or gallop GI: soft, non-tender; bowel sounds normal; no masses,  no organomegaly Extremities: extremities normal, atraumatic, no cyanosis or edema  Lab Results:  Results for orders placed or performed during the hospital encounter of 01/12/14 (from the past 48 hour(s))  Culture, expectorated sputum-assessment     Status: None   Collection Time: 01/13/14 10:00 AM  Result Value Ref Range   Specimen Description SPUTUM EXPECTORATED    Special Requests NONE    Sputum evaluation      THIS SPECIMEN IS ACCEPTABLE. RESPIRATORY CULTURE REPORT TO FOLLOW. Performed at [facility]    Report Status 01/13/2014 FINAL   Culture, respiratory (NON-Expectorated)     Status: None   Collection Time: 01/13/14 10:00 AM  Result Value Ref Range   Specimen Description SPUTUM EXPECTORATED    Special Requests NONE    Gram Stain      ABUNDANT WBC PRESENT,BOTH PMN AND MONONUCLEAR NO SQUAMOUS EPITHELIAL CELLS SEEN FEW GRAM POSITIVE  COCCI IN PAIRS IN CLUSTERS Performed at [facility]    Culture      NORMAL OROPHARYNGEAL FLORA Performed at [facility]    Report Status 01/15/2014 FINAL   Culture, blood (routine x 2)     Status: None (Preliminary result)   Collection Time: 01/13/14 10:03 AM  Result Value Ref Range   Specimen Description BLOOD RIGHT ARM    Special Requests BOTTLES DRAWN AEROBIC AND ANAEROBIC 5CC    Culture NO GROWTH 1 DAY    Report Status PENDING   Culture, blood (routine x 2)     Status: None (Preliminary result)   Collection Time: 01/13/14 10:17 AM  Result Value Ref Range   Specimen Description BLOOD RIGHT HAND    Special Requests BOTTLES DRAWN AEROBIC AND ANAEROBIC 5CC    Culture NO GROWTH 1 DAY    Report Status PENDING   Blood gas, arterial     Status: Abnormal   Collection Time: 01/13/14  3:49 PM  Result Value Ref Range   FIO2 100.00 %   Delivery systems OXYGEN MASK    pH, Arterial 7.415 7.350 - 7.450   pCO2 arterial 36.9 35.0 - 45.0 mmHg   pO2, Arterial 64.7 (L) 80.0 - 100.0 mmHg   Bicarbonate 23.2 20.0 - 24.0 mEq/L   TCO2 20.6 0 - 100 mmol/L   Acid-base deficit 0.8 0.0 - 2.0 mmol/L   O2 Saturation 92.8 %   Patient temperature 37.0    Collection site LEFT RADIAL  Drawn by COLLECTED BY RT    Sample type ARTERIAL    Allens test (pass/fail) PASS PASS  CBC with Differential     Status: Abnormal   Collection Time: 01/15/14  8:23 AM  Result Value Ref Range   WBC 4.9 4.0 - 10.5 K/uL   RBC 4.25 4.22 - 5.81 MIL/uL   Hemoglobin 12.8 (L) 13.0 - 17.0 g/dL   HCT 65.7 (L) 84.6 - 96.2 %   MCV 86.8 78.0 - 100.0 fL   MCH 30.1 26.0 - 34.0 pg   MCHC 34.7 30.0 - 36.0 g/dL   RDW 95.2 84.1 - 32.4 %   Platelets 147 (L) 150 - 400 K/uL   Neutrophils Relative % 77 43 - 77 %   Neutro Abs 3.8 1.7 - 7.7 K/uL   Lymphocytes Relative 13 12 - 46 %   Lymphs Abs 0.6 (L) 0.7 - 4.0 K/uL   Monocytes Relative 9 3 - 12 %   Monocytes Absolute 0.4 0.1 - 1.0 K/uL   Eosinophils Relative 1 0 -  5 %   Eosinophils Absolute 0.1 0.0 - 0.7 K/uL   Basophils Relative 0 0 - 1 %   Basophils Absolute 0.0 0.0 - 0.1 K/uL    ABGS  Recent Labs  01/13/14 1549  PHART 7.415  PO2ART 64.7*  TCO2 20.6  HCO3 23.2   CULTURES Recent Results (from the past 240 hour(s))  Urine culture     Status: None   Collection Time: 01/12/14  3:01 PM  Result Value Ref Range Status   Specimen Description URINE, CLEAN CATCH  Final   Special Requests NONE  Final   Culture  Setup Time   Final    01/12/2014 21:38 Performed at [facility]   Final    30,000 COLONIES/ML Performed at [facility]    Culture   Final    Multiple bacterial morphotypes present, none predominant. Suggest appropriate recollection if clinically indicated. Performed at [facility]    Report Status 01/14/2014 FINAL  Final  MRSA PCR Screening     Status: None   Collection Time: 01/13/14  6:16 AM  Result Value Ref Range Status   MRSA by PCR NEGATIVE NEGATIVE Final    Comment:        The GeneXpert MRSA Assay (FDA approved for NASAL specimens only), is one component of a comprehensive MRSA colonization surveillance program. It is not intended to diagnose MRSA infection nor to guide or monitor treatment for MRSA infections.   Culture, expectorated sputum-assessment     Status: None   Collection Time: 01/13/14 10:00 AM  Result Value Ref Range Status   Specimen Description SPUTUM EXPECTORATED  Final   Special Requests NONE  Final   Sputum evaluation   Final    THIS SPECIMEN IS ACCEPTABLE. RESPIRATORY CULTURE REPORT TO FOLLOW. Performed at [facility]    Report Status 01/13/2014 FINAL  Final  Culture, respiratory (NON-Expectorated)     Status: None   Collection Time: 01/13/14 10:00 AM  Result Value Ref Range Status   Specimen Description SPUTUM EXPECTORATED  Final   Special Requests NONE  Final   Gram Stain   Final    ABUNDANT WBC PRESENT,BOTH PMN AND MONONUCLEAR NO  SQUAMOUS EPITHELIAL CELLS SEEN FEW GRAM POSITIVE COCCI IN PAIRS IN CLUSTERS Performed at [facility]    Culture   Final    NORMAL OROPHARYNGEAL FLORA Performed at [facility]  Report Status 01/15/2014 FINAL  Final  Culture, blood (routine x 2)     Status: None (Preliminary result)   Collection Time: 01/13/14 10:03 AM  Result Value Ref Range Status   Specimen Description BLOOD RIGHT ARM  Final   Special Requests BOTTLES DRAWN AEROBIC AND ANAEROBIC 5CC  Final   Culture NO GROWTH 1 DAY  Final   Report Status PENDING  Incomplete  Culture, blood (routine x 2)     Status: None (Preliminary result)   Collection Time: 01/13/14 10:17 AM  Result Value Ref Range Status   Specimen Description BLOOD RIGHT HAND  Final   Special Requests BOTTLES DRAWN AEROBIC AND ANAEROBIC 5CC  Final   Culture NO GROWTH 1 DAY  Final   Report Status PENDING  Incomplete   Studies/Results: Dg Chest Port 1 View  01/14/2014   CLINICAL DATA:  Increased shortness of breath, on BiPAP, sepsis, acute renal failure, acute respiratory failure, community acquired pneumonia, dehydration  EXAM: PORTABLE CHEST - 1 VIEW  COMPARISON:  Portable exam 1338 hr compared to 01/13/2014  FINDINGS: Enlargement of cardiac silhouette.  Mediastinal contours normal.  BILATERAL pulmonary infiltrates asymmetrically greater on LEFT most consistent with pneumonia.  Question accompanying LEFT pleural effusion.  Pulmonary infiltrates appear increased since previous exam.  No pneumothorax or acute osseous findings.  IMPRESSION: BILATERAL pulmonary infiltrates LEFT greater than RIGHT consistent with pneumonia, increased since previous exam.  Cannot exclude component of LEFT pleural effusion.  Mild enlargement of cardiac silhouette.   Electronically Signed   By: Ulyses SouthwardMark  Boles M.D.   On: 01/14/2014 14:02    Medications:  Prior to Admission:  Prescriptions prior to admission  Medication Sig Dispense Refill Last Dose  . ondansetron  (ZOFRAN) 4 MG tablet Take 4 mg by mouth every 8 (eight) hours as needed for nausea or vomiting.   01/12/2014 at Unknown time   Scheduled: . antiseptic oral rinse  7 mL Mouth Rinse q12n4p  . chlorhexidine  15 mL Mouth Rinse BID  . guaiFENesin  1,200 mg Oral BID  . levalbuterol  0.63 mg Nebulization Q6H WA  . levofloxacin (LEVAQUIN) IV  750 mg Intravenous Q24H  . piperacillin-tazobactam (ZOSYN)  IV  3.375 g Intravenous Q8H  . simethicone      . vancomycin  1,000 mg Intravenous Q12H   Continuous: . sodium chloride Stopped (01/12/14 1655)  . sodium chloride 150 mL/hr at 01/15/14 0815   ZOX:[id number]:acetaminophen **OR** acetaminophen, alum & mag hydroxide-simeth, docusate sodium, guaiFENesin-dextromethorphan, HYDROcodone-acetaminophen, LORazepam, ondansetron, ondansetron (ZOFRAN) IV, promethazine  Assesment: He was admitted with community-acquired pneumonia. He has been septic. He remains hypoxic. He had marked tachycardia related to his sepsis and pneumonia. He has continued to have very high oxygen requirements. Chest x-ray yesterday was worse with bilateral pneumonia Active Problems:   Acute respiratory failure with hypoxia   Acute renal insufficiency   Dehydration   CAP (community acquired pneumonia)   UTI (lower urinary tract infection)   Sepsis    Plan: I discussed all this with his family and with Dr. Ardyth HarpsHernandez hospitalist attending. I will be out of town for the next 5 days. I think he needs to be transferred to a higher level of care because he has significant potential for needing intubation and mechanical ventilation    LOS: 3 days   Kilo Eshelman L 01/15/2014, 8:57 AM

## 2014-01-15 NOTE — Plan of Care (Signed)
Problem: Phase I Progression Outcomes Goal: Dyspnea controlled at rest Outcome: Completed/Met Date Met:  01/15/14 Goal: Pain controlled with appropriate interventions Outcome: Completed/Met Date Met:  01/15/14 Goal: Confirm chest x-ray completed Outcome: Completed/Met Date Met:  01/15/14 Goal: Consider pulmonary consult Outcome: Completed/Met Date Met:  01/15/14 Goal: Code status addressed with pt/family Outcome: Not Applicable Date Met:  01/15/14     

## 2014-01-15 NOTE — Progress Notes (Signed)
Patient briefly seen and examined, chart reviewed, case discussed with parents at bedside and with Dr. Juanetta GoslingHawkins. Omar Rodriguez is here with bilateral (L>R) CAP with high oxygen requirements and worsening CXR. Dr. Juanetta GoslingHawkins believes [facility] in his best [facility]. He has arranged transfer with CCM. Continue vanc/zosyn/levaquin.  [name], MD [facility] Pager: [phone]

## 2014-01-15 NOTE — Progress Notes (Signed)
INITIAL NUTRITION ASSESSMENT  DOCUMENTATION CODES Per approved criteria  -Not Applicable   INTERVENTION:  Initiate TF via OGT with Vital AF 1.2 at 25 ml/h and Prostat 30 ml BID on day 1; on day 2, d/c Prostat and increase to goal rate of 75 ml/h (1800 ml per day) to provide 2160 kcals, 135 gm protein, 1505 ml free water daily.  NUTRITION DIAGNOSIS: Inadequate oral intake related to inability to eat as evidenced by NPO status.   Goal: Intake to meet >90% of estimated nutrition needs.  Monitor:  TF tolerance/adequacy, weight trend, labs, vent status.  Reason for Assessment: MD Consult for TF initiation and management.  23 y.o. male  Admitting Dx: Respiratory failure, CAP  ASSESSMENT: 23 yo male [facility] 11[facility] with sepsis r/t CAP. Previously had presumed gastroenteritis with n/v/d a few days prior. [facility] with worsening respiratory failure on 12/2 and required intubation.   Nutrition focused physical exam completed.  No muscle or subcutaneous fat depletion noticed. Per discussion with patient's mother, patient typically eats well, no nutrition concerns PTA. Had eaten poorly for a few days recently when he had a stomach bug.  Patient is currently intubated on ventilator support MV: 8.1 L/min Temp (24hrs), Avg:99.2 F (37.3 C), Min:97.7 F (36.5 C), Max:102.8 F (39.3 C)   Height: Ht Readings from Last 1 Encounters:  01/15/14 5\' 8"  (1.727 m)    Weight: Wt Readings from Last 1 Encounters:  01/15/14 160 lb (72.576 kg)    Ideal Body Weight: 70 kg  % Ideal Body Weight: 104%  Wt Readings from Last 10 Encounters:  01/15/14 160 lb (72.576 kg)    Usual Body Weight: unknown  % Usual Body Weight: N/A  BMI:  Body mass index is 24.33 kg/(m^2).  Estimated Nutritional Needs: Kcal: 2225 Protein: 110-125 gm Fluid: 2.2 L  Skin: WDL  Diet Order: Diet NPO time specified  EDUCATION NEEDS: -Education not appropriate at this  time   Intake/Output Summary (Last 24 hours) at 01/15/14 1417 Last data filed at 01/15/14 0815  Gross per 24 hour  Intake   4650 ml  Output   3375 ml  Net   1275 ml    Last BM: PTA   Labs:   Recent Labs Lab 01/12/14 1327 01/13/14 0728 01/15/14 0823  NA 134* 138  139 134*  K 4.1 4.3  4.2 4.0  CL 93* 100  101 97  CO2 27 22  24 26   BUN 15 11  10 9   CREATININE 1.47* 1.23  1.21 0.99  CALCIUM 8.6 7.5*  7.5* 7.5*  PHOS  --  2.6  --   GLUCOSE 105* 85  96 135*    CBG (last 3)  No results for input(s): GLUCAP in the last 72 hours.  Scheduled Meds: . antiseptic oral rinse  7 mL Mouth Rinse q12n4p  . antiseptic oral rinse  7 mL Mouth Rinse QID  . chlorhexidine  15 mL Mouth Rinse BID  . chlorhexidine  15 mL Mouth Rinse BID  . heparin subcutaneous  5,000 Units Subcutaneous 3 times per day  . levalbuterol  0.63 mg Nebulization Q6H WA  . levofloxacin (LEVAQUIN) IV  750 mg Intravenous Q24H  . pantoprazole (PROTONIX) IV  40 mg Intravenous QHS  . piperacillin-tazobactam (ZOSYN)  IV  3.375 g Intravenous Q8H  . simethicone      . vancomycin  1,000 mg Intravenous Q12H    Continuous Infusions: . sodium chloride Stopped (01/12/14 1655)  .  fentaNYL infusion INTRAVENOUS 100 mcg/hr (01/15/14 1401)    History reviewed. No pertinent past medical history.  History reviewed. No pertinent past surgical history.   Joaquin CourtsKimberly Farrin Shadle, RD, LDN, CNSC Pager [phone] After Hours Pager [phone]

## 2014-01-15 NOTE — Procedures (Signed)
Intubation Procedure Note Omar Rodriguez [id number] 09/30/1990  Procedure: Intubation Indications: Airway protection and maintenance  Procedure Details Consent: Risks of procedure as well as the alternatives and risks of each were explained to the (patient/caregiver).  Consent for procedure obtained. Time Out: Verified patient identification, verified procedure, site/side was marked, verified correct patient position, special equipment/implants available, medications/allergies/relevent history reviewed, required imaging and test results available.  Performed  Maximum sterile technique was used including gloves and hand hygiene.  MAC and 3    Evaluation Hemodynamic Status: BP stable throughout; O2 sats: stable throughout Patient's Current Condition: stable Complications: No apparent complications Patient did tolerate procedure well. Chest X-ray ordered to verify placement.  CXR: pending.   Omar Rodriguez, Omar Rodriguez 01/15/2014

## 2014-01-15 NOTE — Procedures (Signed)
Intubation Procedure Note Omar Rodriguez [id number] 03/06/1990  Procedure: Intubation Indications: Respiratory insufficiency  Procedure Details Consent: Risks of procedure as well as the alternatives and risks of each were explained to the (patient/caregiver).  Consent for procedure obtained. Time Out: Verified patient identification, verified procedure, site/side was marked, verified correct patient position, special equipment/implants available, medications/allergies/relevent history reviewed, required imaging and test results available.  Performed  Maximum sterile technique was used including gloves, hand hygiene and mask.  MAC    Evaluation Hemodynamic Status: BP stable throughout; O2 sats: stable throughout Patient's Current Condition: stable Complications: No apparent complications Patient did tolerate procedure well. Chest X-ray ordered to verify placement.  CXR: pending.   Omar Rodriguez,Omar Rodriguez 01/15/2014

## 2014-01-15 NOTE — Procedures (Signed)
Central Venous Catheter Insertion Procedure Note Jonna Coupony C Demond [id number] 06/05/1990  Procedure: Insertion of Central Venous Catheter Indications: Assessment of intravascular volume, Drug and/or fluid administration and Frequent blood sampling  Procedure Details Consent: Risks of procedure as well as the alternatives and risks of each were explained to the (patient/caregiver).  Consent for procedure obtained. Time Out: Verified patient identification, verified procedure, site/side was marked, verified correct patient position, special equipment/implants available, medications/allergies/relevent history reviewed, required imaging and test results available.  Performed  Maximum sterile technique was used including antiseptics, cap, gloves, gown, hand hygiene, mask and sheet. Skin prep: Chlorhexidine; local anesthetic administered A antimicrobial bonded/coated triple lumen catheter was placed in the right internal jugular vein using the Seldinger technique. Ultrasound guidance used.Yes.   Catheter placed to 17 cm. Blood aspirated via all 3 ports and then flushed x 3. Line sutured x 2 and dressing applied.  Evaluation Blood flow good Complications: No apparent complications Patient did tolerate procedure well. Chest X-ray ordered to verify placement.  CXR: pending. Insertion per Henry RusselLesia Tapia PA student  Brett CanalesSteve Andreanna Mikolajczak ACNP [facility] Pager [phone] till 3 pm If no answer page [phone] 01/15/2014, 1:09 PM

## 2014-01-15 NOTE — Progress Notes (Addendum)
ANTIBIOTIC CONSULT NOTE - FOLLOW UP  Pharmacy Consult for vancomycin, Zosyn, levaquin Indication: rule out pneumonia  Allergies  Allergen Reactions  . Ceclor [Cefaclor] Hives    Patient Measurements: Height: 5\' 8"  (172.7 cm) Weight: 160 lb (72.576 kg) IBW/kg (Calculated) : 68.4  Vital Signs: Temp: 99.4 F (37.4 C) (12/02 1203) Temp Source: Axillary (12/02 1203) BP: 108/61 mmHg (12/02 1500) Pulse Rate: 103 (12/02 1500) Intake/Output from previous day: 12/01 0701 - 12/02 0700 In: 4450 [I.V.:3750; IV Piggyback:700] Out: 3860 [Urine:3860] Intake/Output from this shift: Total I/O In: 409.8 [I.V.:159.8; IV Piggyback:250] Out: 900 [Urine:900]  Labs:  Recent Labs  01/13/14 0728 01/15/14 0823  WBC 4.8  4.9 4.9  HGB 14.2  14.2 12.8*  PLT 107*  100* 147*  CREATININE 1.23  1.21 0.99   Estimated Creatinine Clearance: 111.3 mL/min (by C-G formula based on Cr of 0.99).  Recent Labs  01/15/14 1230  VANCOTROUGH <5.0*     Microbiology: Recent Results (from the past 720 hour(s))  Urine culture     Status: None   Collection Time: 01/12/14  3:01 PM  Result Value Ref Range Status   Specimen Description URINE, CLEAN CATCH  Final   Special Requests NONE  Final   Culture  Setup Time   Final    01/12/2014 21:38 Performed at [facility]    Colony Count   Final    30,000 COLONIES/ML Performed at [facility]    Culture   Final    Multiple bacterial morphotypes present, none predominant. Suggest appropriate recollection if clinically indicated. Performed at [facility]    Report Status 01/14/2014 FINAL  Final  MRSA PCR Screening     Status: None   Collection Time: 01/13/14  6:16 AM  Result Value Ref Range Status   MRSA by PCR NEGATIVE NEGATIVE Final    Comment:        The GeneXpert MRSA Assay (FDA approved for NASAL specimens only), is one component of a comprehensive MRSA colonization surveillance program. It is not intended to diagnose  MRSA infection nor to guide or monitor treatment for MRSA infections.   Culture, expectorated sputum-assessment     Status: None   Collection Time: 01/13/14 10:00 AM  Result Value Ref Range Status   Specimen Description SPUTUM EXPECTORATED  Final   Special Requests NONE  Final   Sputum evaluation   Final    THIS SPECIMEN IS ACCEPTABLE. RESPIRATORY CULTURE REPORT TO FOLLOW. Performed at [facility]    Report Status 01/13/2014 FINAL  Final  Culture, respiratory (NON-Expectorated)     Status: None   Collection Time: 01/13/14 10:00 AM  Result Value Ref Range Status   Specimen Description SPUTUM EXPECTORATED  Final   Special Requests NONE  Final   Gram Stain   Final    ABUNDANT WBC PRESENT,BOTH PMN AND MONONUCLEAR NO SQUAMOUS EPITHELIAL CELLS SEEN FEW GRAM POSITIVE COCCI IN PAIRS IN CLUSTERS Performed at [facility]    Culture   Final    NORMAL OROPHARYNGEAL FLORA Performed at [facility]    Report Status 01/15/2014 FINAL  Final  Culture, blood (routine x 2)     Status: None (Preliminary result)   Collection Time: 01/13/14 10:03 AM  Result Value Ref Range Status   Specimen Description BLOOD RIGHT ARM  Final   Special Requests BOTTLES DRAWN AEROBIC AND ANAEROBIC 5CC  Final   Culture NO GROWTH 2 DAYS  Final   Report Status PENDING  Incomplete  Culture, blood (routine x 2)     Status: None (Preliminary result)   Collection Time: 01/13/14 10:17 AM  Result Value Ref Range Status   Specimen Description BLOOD RIGHT HAND  Final   Special Requests BOTTLES DRAWN AEROBIC AND ANAEROBIC 5CC  Final   Culture NO GROWTH 2 DAYS  Final   Report Status PENDING  Incomplete    Anti-infectives    Start     Dose/Rate Route Frequency Ordered Stop   01/15/14 1359  vancomycin (VANCOCIN) IVPB 1000 mg/200 mL premix     1,000 mg200 mL/hr over 60 Minutes Intravenous Every 12 hours 01/15/14 1359     01/14/14 0000  vancomycin (VANCOCIN) IVPB 1000 mg/200 mL premix  Status:   Discontinued     1,000 mg200 mL/hr over 60 Minutes Intravenous Every 12 hours 01/13/14 1125 01/15/14 1359   01/13/14 2000  vancomycin (VANCOCIN) IVPB 1000 mg/200 mL premix  Status:  Discontinued     1,000 mg200 mL/hr over 60 Minutes Intravenous Every 12 hours 01/13/14 0651 01/13/14 1125   01/13/14 1600  levofloxacin (LEVAQUIN) IVPB 750 mg     750 mg100 mL/hr over 90 Minutes Intravenous Every 24 hours 01/12/14 1740     01/13/14 1430  oseltamivir (TAMIFLU) capsule 75 mg  Status:  Discontinued     75 mg Oral 2 times daily 01/13/14 1422 01/13/14 1422   01/13/14 1200  vancomycin (VANCOCIN) 1,500 mg in sodium chloride 0.9 % 500 mL IVPB     1,500 mg250 mL/hr over 120 Minutes Intravenous  Once 01/13/14 1123 01/13/14 1356   01/13/14 0800  vancomycin (VANCOCIN) 1,500 mg in sodium chloride 0.9 % 500 mL IVPB  Status:  Discontinued     1,500 mg250 mL/hr over 120 Minutes Intravenous  Once 01/13/14 0651 01/13/14 1123   01/13/14 0800  piperacillin-tazobactam (ZOSYN) IVPB 3.375 g     3.375 g12.5 mL/hr over 240 Minutes Intravenous Every 8 hours 01/13/14 0654     01/13/14 0700  piperacillin-tazobactam (ZOSYN) IVPB 3.375 g  Status:  Discontinued     3.375 g12.5 mL/hr over 240 Minutes Intravenous Every 8 hours 01/13/14 0651 01/13/14 0654   01/12/14 1545  levofloxacin (LEVAQUIN) IVPB 750 mg  Status:  Discontinued     750 mg100 mL/hr over 90 Minutes Intravenous  Once 01/12/14 1538 01/12/14 1538   01/12/14 1545  levofloxacin (LEVAQUIN) IVPB 750 mg     750 mg100 mL/hr over 90 Minutes Intravenous  Once 01/12/14 1539 01/12/14 1656      Assessment: 23 year old male originally admitted [facility] 01[facility]13[facility]14 for pneumonia with progressive worsening of respiratory status and [facility].  Patient is on day #3 of vancomycin, Zosyn, and Levaquin. SCr is 0.99 (trending down from admission) with estimated CrCl >17300mL/min. PCT 0.93, LA 1. Tmax 99.4, WBC wnl. CXR showed progressive  worsening.   Vancomycin level was drawn after patient transferred -level is un-dectable.  Goal of Therapy:  Vancomycin trough level 15-20 mcg/ml  Clinical resolution of infection.  Plan:  Give extra 500mg  dose now (total 1500mg  dose), then increase vancomycin to 1250 mg IV q8h.  Continue Zosyn 3.375g IV q8h- each dose over 4 hours. Continue Levaquin 750mg  IV q24h.  Follow-up renal function, clinical status, and culture results. Recheck vancomycin trough prior to 4th dose if continuing therapy.   Link SnufferJessica Winfrey Chillemi, PharmD, BCPS Clinical Pharmacist [phone]  01/15/2014,3:31 PM

## 2014-01-15 NOTE — H&P (Signed)
PULMONARY / CRITICAL CARE MEDICINE   Name: Omar Rodriguez C Amparan MRN: [id number] DOB: 02/28/1990    ADMISSION DATE:  01/12/2014  REFERRING MD :  Ardyth HarpsHernandez ([place], [place])   CHIEF COMPLAINT:  Respiratory failure, CAP   INITIAL PRESENTATION: 23yo otherwise well young male presented 11/[facility] with 6 day hx fever, chills productive cough.  Of note previously had presumed gastroenteritis with n/v/d a few days prior.  Admitted with sepsis r/t CAP.  Had progressive hypoxia, requiring bipap intermittently, worsening CXR.  Treated initially with levaquin, transitioned to Vanc/Zosyn and tx [facility] with worsening respiratory failure.    STUDIES:  CT chest 11/30>>> L>R multifocal infiltrates, small bilat effusions   SIGNIFICANT EVENTS: 11/29 admit [place], CAP, sepsis  12/2 tx [place] with worsening hypoxic resp failure    HISTORY OF PRESENT ILLNESS:  23yo otherwise well young male presented [facility] with 6 day hx fever, chills productive cough.  Of note previously had presumed gastroenteritis with n/v/d a few days prior.  Admitted with sepsis r/t CAP.  Had progressive hypoxia, requiring bipap intermittently, worsening CXR.  Treated initially with levaquin, transitioned to Vanc/Zosyn and tx [facility] with worsening respiratory failure.   Now off bipap, requiring NRB.  C/o SOB, malaise.  Denies chest pain, hemoptysis.    PAST MEDICAL HISTORY :   has no past medical history on file.  has no past surgical history on file. Prior to Admission medications   Medication Sig Start Date End Date Taking? Authorizing Provider  ondansetron (ZOFRAN) 4 MG tablet Take 4 mg by mouth every 8 (eight) hours as needed for nausea or vomiting.   Yes Historical Provider, MD   Allergies  Allergen Reactions  . Ceclor [Cefaclor] Hives    FAMILY HISTORY:  has no family status information on file.  SOCIAL HISTORY:  reports that he has never smoked. He has never used smokeless  tobacco. He reports that he does not drink alcohol or use illicit drugs.  REVIEW OF SYSTEMS:  Difficult to obtain r/t SOB, see HPI.    SUBJECTIVE:   VITAL SIGNS: Temp:  [97.7 F (36.5 C)-102.8 F (39.3 C)] 99.4 F (37.4 C) (12/02 1203) Pulse Rate:  [102-141] 110 (12/02 1200) Resp:  [12-43] 25 (12/02 1200) BP: (100-138)/(36-93) 112/80 mmHg (12/02 1200) SpO2:  [85 %-99 %] 95 % (12/02 1200) FiO2 (%):  [50 %-70 %] 55 % (12/02 0453) Weight:  [160 lb (72.576 kg)-176 lb 9.4 oz (80.1 kg)] 160 lb (72.576 kg) (12/02 1139) HEMODYNAMICS:   VENTILATOR SETTINGS: Vent Mode:  [-]  FiO2 (%):  [50 %-70 %] 55 % INTAKE / OUTPUT:  Intake/Output Summary (Last 24 hours) at 01/15/14 1211 Last data filed at 01/15/14 0815  Gross per 24 hour  Intake   4650 ml  Output   3785 ml  Net    865 ml    PHYSICAL EXAMINATION: General:  wdwn young male, flushed, anxious appearing  Neuro:  Awake, alert, appropriate, MAe  HEENT:  Mm dry, NRB, no JVD Cardiovascular:  s1s2 rrr Lungs:  resps even, shallow, labored on NRB, diminished L,  Abdomen:  Mildly tender diffusely, +bs Musculoskeletal:  Warm and dry, no edema    LABS:  CBC  Recent Labs Lab 01/12/14 1327 01/13/14 0728 01/15/14 0823  WBC 5.3 4.8  4.9 4.9  HGB 15.8 14.2  14.2 12.8*  HCT 45.1 41.1  40.9 36.9*  PLT 118* 107*  100* 147*  Coag's No results for input(s): APTT, INR in the last 168 hours. BMET  Recent Labs Lab 01/12/14 1327 01/13/14 0728 01/15/14 0823  NA 134* 138  139 134*  K 4.1 4.3  4.2 4.0  CL 93* 100  101 97  CO2 27 22  24 26   BUN 15 11  10 9   CREATININE 1.47* 1.23  1.21 0.99  GLUCOSE 105* 85  96 135*   Electrolytes  Recent Labs Lab 01/12/14 1327 01/13/14 0728 01/15/14 0823  CALCIUM 8.6 7.5*  7.5* 7.5*  PHOS  --  2.6  --    Sepsis Markers  Recent Labs Lab 01/13/14 0728  LATICACIDVEN 1.3   ABG  Recent Labs Lab 01/13/14 0330 01/13/14 1549 01/15/14 1023  PHART 7.382 7.415 7.409    PCO2ART 41.0 36.9 44.5  PO2ART 62.4* 64.7* 84.4   Liver Enzymes  Recent Labs Lab 01/12/14 1327 01/13/14 0728  AST 63* 52*  ALT 50 39  ALKPHOS 73 65  BILITOT 2.0* 1.2  ALBUMIN 3.3* 2.4*   Cardiac Enzymes  Recent Labs Lab 01/13/14 0728  PROBNP 691.1*   Glucose No results for input(s): GLUCAP in the last 168 hours.  Imaging Dg Chest Port 1 View  01/14/2014   CLINICAL DATA:  Increased shortness of breath, on BiPAP, sepsis, acute renal failure, acute respiratory failure, community acquired pneumonia, dehydration  EXAM: PORTABLE CHEST - 1 VIEW  COMPARISON:  Portable exam 1338 hr compared to 01/13/2014  FINDINGS: Enlargement of cardiac silhouette.  Mediastinal contours normal.  BILATERAL pulmonary infiltrates asymmetrically greater on LEFT most consistent with pneumonia.  Question accompanying LEFT pleural effusion.  Pulmonary infiltrates appear increased since previous exam.  No pneumothorax or acute osseous findings.  IMPRESSION: BILATERAL pulmonary infiltrates LEFT greater than RIGHT consistent with pneumonia, increased since previous exam.  Cannot exclude component of LEFT pleural effusion.  Mild enlargement of cardiac silhouette.   Electronically Signed   By: Ulyses Southward M.D.   On: 01/14/2014 14:02     U/S L chest 12/2  ASSESSMENT / PLAN:  PULMONARY OETT 12/2>>> Acute hypoxic respiratory failure r/t CAP  CAP v aspiration PNA (with recent hx n/v) P:   Intubate  Vent support - 8cc/kg  F/u CXR  F/u ABG  U/S eval chest reveals no sig fluid accumulation amenable to thoracentesis - see image above   CARDIOVASCULAR CVL L IJ CVL 12/2>>> SIRS  P:  F/u lactate, pct  Gentle volume   RENAL Hyponatremia - mild  P:   F/u chem  Gentle volume   GASTROINTESTINAL No active issue  P:   PPI   HEMATOLOGIC Thrombocytopenia - mild.  P:  F/u cbc  SQ heparin   INFECTIOUS CAP P:   BCx2 11/30>>> UC 12/2>>> Sputum 11/30>>> normal flora Flu PCR 11/30>>> NEG  RVP  12/2>>> BAL 12/2>>> Vancomycin 11/30>>> Zosyn 11/30>>> Levaquin 11/30>>>  ENDOCRINE No active issue  P:   Monitor glucose on chem   NEUROLOGIC No active issue  P:   RASS goal: -1 PRN sedation via protocol  PAD - 1 Sedation.  FAMILY  - Updates:  Parents updated at bedside 12/2  Dirk Dress, NP 01/15/2014  12:11 PM Pager: [phone] or [phone]  Will intubate now.  Continue abx.  Start TF.  Will check chest U/S if fluid is present then will need drainage.  Will keep sedated with high PEEP until oxygenation improves.  The patient is critically ill with multiple organ systems failure and requires high complexity decision making  for assessment and support, frequent evaluation and titration of therapies, application of advanced monitoring technologies and extensive interpretation of multiple databases.   Critical Care Time devoted to patient care services described in this note is 45 Minutes. This time reflects time of care of this signee Dr Koren BoundWesam Ashley Bultema. This critical care time does not reflect procedure time, or teaching time or supervisory time of PA/NP/Med student/Med Resident etc but could involve care discussion time.  Alyson ReedyWesam G. Suzetta Timko, M.D. [facility] Pulmonary/Critical Care Medicine. Pager: [phone]. After hours pager: [phone].

## 2014-01-15 NOTE — Plan of Care (Signed)
Problem: Consults Goal: Pneumonia Patient Education See Patient Educatio Module for education specifics.  Outcome: Completed/Met Date Met:  01/15/14 Goal: Skin Care Protocol Initiated - if Braden Score 18 or less If consults are not indicated, leave blank or document N/A  Outcome: Completed/Met Date Met:  01/15/14 Goal: Nutrition Consult-if indicated Outcome: Completed/Met Date Met:  01/15/14

## 2014-01-16 ENCOUNTER — Inpatient Hospital Stay (HOSPITAL_COMMUNITY): Payer: Medicaid - Out of State

## 2014-01-16 LAB — BASIC METABOLIC PANEL
Anion gap: 13 (ref 5–15)
BUN: 17 mg/dL (ref 6–23)
CHLORIDE: 98 meq/L (ref 96–112)
CO2: 25 meq/L (ref 19–32)
CREATININE: 1.59 mg/dL — AB (ref 0.50–1.35)
Calcium: 7.8 mg/dL — ABNORMAL LOW (ref 8.4–10.5)
GFR calc Af Amer: 69 mL/min — ABNORMAL LOW (ref >90)
GFR calc non Af Amer: 59 mL/min — ABNORMAL LOW (ref >90)
GLUCOSE: 119 mg/dL — AB (ref 70–99)
Potassium: 4.4 mEq/L (ref 3.7–5.3)
Sodium: 136 mEq/L — ABNORMAL LOW (ref 137–147)

## 2014-01-16 LAB — PHOSPHORUS: Phosphorus: 3.3 mg/dL (ref 2.3–4.6)

## 2014-01-16 LAB — BLOOD GAS, ARTERIAL
Acid-Base Excess: 1.8 mmol/L (ref 0.0–2.0)
Bicarbonate: 26.5 mEq/L — ABNORMAL HIGH (ref 20.0–24.0)
Drawn by: 347621
FIO2: 0.7 %
LHR: 16 {breaths}/min
MECHVT: 500 mL
O2 Saturation: 96.7 %
PATIENT TEMPERATURE: 98.4
PEEP/CPAP: 10 cmH2O
TCO2: 27.9 mmol/L (ref 0–100)
pCO2 arterial: 45.9 mmHg — ABNORMAL HIGH (ref 35.0–45.0)
pH, Arterial: 7.378 (ref 7.350–7.450)
pO2, Arterial: 91.5 mmHg (ref 80.0–100.0)

## 2014-01-16 LAB — URINE CULTURE
COLONY COUNT: NO GROWTH
Culture: NO GROWTH

## 2014-01-16 LAB — PROCALCITONIN: Procalcitonin: 1 ng/mL

## 2014-01-16 LAB — CBC
HEMATOCRIT: 36.3 % — AB (ref 39.0–52.0)
Hemoglobin: 11.8 g/dL — ABNORMAL LOW (ref 13.0–17.0)
MCH: 28.6 pg (ref 26.0–34.0)
MCHC: 32.5 g/dL (ref 30.0–36.0)
MCV: 88.1 fL (ref 78.0–100.0)
Platelets: 158 10*3/uL (ref 150–400)
RBC: 4.12 MIL/uL — AB (ref 4.22–5.81)
RDW: 13.4 % (ref 11.5–15.5)
WBC: 4.7 10*3/uL (ref 4.0–10.5)

## 2014-01-16 LAB — GLUCOSE, CAPILLARY
GLUCOSE-CAPILLARY: 109 mg/dL — AB (ref 70–99)
GLUCOSE-CAPILLARY: 116 mg/dL — AB (ref 70–99)
GLUCOSE-CAPILLARY: 129 mg/dL — AB (ref 70–99)
GLUCOSE-CAPILLARY: 134 mg/dL — AB (ref 70–99)
GLUCOSE-CAPILLARY: 136 mg/dL — AB (ref 70–99)
Glucose-Capillary: 120 mg/dL — ABNORMAL HIGH (ref 70–99)

## 2014-01-16 LAB — MAGNESIUM: Magnesium: 2.4 mg/dL (ref 1.5–2.5)

## 2014-01-16 LAB — HIV ANTIBODY (ROUTINE TESTING W REFLEX): HIV: NONREACTIVE

## 2014-01-16 MED ORDER — LEVALBUTEROL HCL 0.63 MG/3ML IN NEBU
0.6300 mg | INHALATION_SOLUTION | RESPIRATORY_TRACT | Status: DC | PRN
  Administered 2014-01-19: 0.63 mg via RESPIRATORY_TRACT
  Filled 2014-01-16: qty 3

## 2014-01-16 MED ORDER — ACETAMINOPHEN 160 MG/5ML PO SOLN
650.0000 mg | Freq: Four times a day (QID) | ORAL | Status: DC | PRN
  Filled 2014-01-16: qty 20.3

## 2014-01-16 MED ORDER — VANCOMYCIN HCL IN DEXTROSE 1-5 GM/200ML-% IV SOLN
1000.0000 mg | Freq: Two times a day (BID) | INTRAVENOUS | Status: DC
  Administered 2014-01-16 – 2014-01-17 (×2): 1000 mg via INTRAVENOUS
  Filled 2014-01-16 (×3): qty 200

## 2014-01-16 MED ORDER — PANTOPRAZOLE SODIUM 40 MG PO PACK
40.0000 mg | PACK | Freq: Every day | ORAL | Status: DC
  Administered 2014-01-16 – 2014-01-18 (×3): 40 mg
  Filled 2014-01-16 (×5): qty 20

## 2014-01-16 MED ORDER — MIDAZOLAM HCL 2 MG/2ML IJ SOLN
2.0000 mg | INTRAMUSCULAR | Status: DC | PRN
  Administered 2014-01-16 – 2014-01-18 (×2): 2 mg via INTRAVENOUS
  Filled 2014-01-16 (×3): qty 2

## 2014-01-16 MED ORDER — ENOXAPARIN SODIUM 40 MG/0.4ML ~~LOC~~ SOLN
40.0000 mg | SUBCUTANEOUS | Status: DC
  Administered 2014-01-16 – 2014-01-20 (×5): 40 mg via SUBCUTANEOUS
  Filled 2014-01-16 (×6): qty 0.4

## 2014-01-16 MED ORDER — SIMETHICONE 40 MG/0.6ML PO SUSP
40.0000 mg | Freq: Four times a day (QID) | ORAL | Status: DC | PRN
  Administered 2014-01-16: 40 mg
  Filled 2014-01-16 (×3): qty 0.6

## 2014-01-16 NOTE — Plan of Care (Signed)
Problem: Consults Goal: Diabetes Guidelines if Diabetic/Glucose > 140 If diabetic or lab glucose is > 140 mg/dl - Initiate Diabetes/Hyperglycemia Guidelines & Document Interventions  Outcome: Not Applicable Date Met:  09/15/21  Problem: Phase I Progression Outcomes Goal: Hemodynamically stable Outcome: Completed/Met Date Met:  01/16/14  Problem: Phase II Progression Outcomes Goal: Pain controlled Outcome: Completed/Met Date Met:  01/16/14 Goal: Progress activity as tolerated unless otherwise ordered Outcome: Progressing Goal: Tolerating diet Outcome: Completed/Met Date Met:  01/16/14

## 2014-01-16 NOTE — Progress Notes (Signed)
eLink Physician-Brief Progress Note Patient Name: Jonna Coupony C Pavich DOB: 08/25/1990 MRN: [id number]   Date of Service  01/16/2014  HPI/Events of Note    eICU Interventions  ARDS protocol activated to t itrate FIO2/PEEP & lower Tv     Intervention Category Major Interventions: Respiratory failure - evaluation and management  ALVA,RAKESH V. 01/16/2014, 12:36 AM

## 2014-01-16 NOTE — Progress Notes (Addendum)
ADDENDUM: Received consult for Lovenox for VTE prophylaxis. Patient is not obese and current renal function is >6930mL/min. Was on subq heparin with last dose at ~0500 this morning.  Plan: -lovenox 40mg  subQ q24h starting today at 1400 -pharmacy will sign of consult, but monitor for any changes that may be required if renal function worsens    ANTIBIOTIC CONSULT NOTE - FOLLOW UP  Pharmacy Consult for vancomycin, Zosyn, Levaquin Indication: rule out pneumonia  Allergies  Allergen Reactions  . Ceclor [Cefaclor] Hives    Patient Measurements: Height: 5\' 8"  (172.7 cm) Weight: 167 lb 8.8 oz (76 kg) IBW/kg (Calculated) : 68.4  Vital Signs: Temp: 98.3 F (36.8 C) (12/03 0841) Temp Source: Oral (12/03 0416) BP: 115/67 mmHg (12/03 0800) Pulse Rate: 106 (12/03 0841) Intake/Output from previous day: 12/02 0701 - 12/03 0700 In: 3919.4 [I.V.:2513.6; NG/GT:355.8; IV Piggyback:1050] Out: 2160 [Urine:2160] Intake/Output from this shift: Total I/O In: -  Out: 550 [Urine:550]  Labs:  Recent Labs  01/15/14 0823 01/15/14 2123 01/16/14 0425  WBC 4.9 4.9 4.7  HGB 12.8* 12.3* 11.8*  PLT 147* 156 158  CREATININE 0.99 1.34 1.59*   Estimated Creatinine Clearance: 69.3 mL/min (by C-G formula based on Cr of 1.59).  Recent Labs  01/15/14 1230  VANCOTROUGH <5.0*     Microbiology: Recent Results (from the past 720 hour(s))  Urine culture     Status: None   Collection Time: 01/12/14  3:01 PM  Result Value Ref Range Status   Specimen Description URINE, CLEAN CATCH  Final   Special Requests NONE  Final   Culture  Setup Time   Final    01/12/2014 21:38 Performed at [facility]    Colony Count   Final    30,000 COLONIES/ML Performed at [facility]    Culture   Final    Multiple bacterial morphotypes present, none predominant. Suggest appropriate recollection if clinically indicated. Performed at [facility]    Report Status 01/14/2014 FINAL  Final   MRSA PCR Screening     Status: None   Collection Time: 01/13/14  6:16 AM  Result Value Ref Range Status   MRSA by PCR NEGATIVE NEGATIVE Final    Comment:        The GeneXpert MRSA Assay (FDA approved for NASAL specimens only), is one component of a comprehensive MRSA colonization surveillance program. It is not intended to diagnose MRSA infection nor to guide or monitor treatment for MRSA infections.   Culture, expectorated sputum-assessment     Status: None   Collection Time: 01/13/14 10:00 AM  Result Value Ref Range Status   Specimen Description SPUTUM EXPECTORATED  Final   Special Requests NONE  Final   Sputum evaluation   Final    THIS SPECIMEN IS ACCEPTABLE. RESPIRATORY CULTURE REPORT TO FOLLOW. Performed at [facility]    Report Status 01/13/2014 FINAL  Final  Culture, respiratory (NON-Expectorated)     Status: None   Collection Time: 01/13/14 10:00 AM  Result Value Ref Range Status   Specimen Description SPUTUM EXPECTORATED  Final   Special Requests NONE  Final   Gram Stain   Final    ABUNDANT WBC PRESENT,BOTH PMN AND MONONUCLEAR NO SQUAMOUS EPITHELIAL CELLS SEEN FEW GRAM POSITIVE COCCI IN PAIRS IN CLUSTERS Performed at [facility]    Culture   Final    NORMAL OROPHARYNGEAL FLORA Performed at [facility]    Report Status 01/15/2014 FINAL  Final  Culture, blood (routine x  2)     Status: None (Preliminary result)   Collection Time: 01/13/14 10:03 AM  Result Value Ref Range Status   Specimen Description BLOOD RIGHT ARM  Final   Special Requests BOTTLES DRAWN AEROBIC AND ANAEROBIC 5CC  Final   Culture NO GROWTH 2 DAYS  Final   Report Status PENDING  Incomplete  Culture, blood (routine x 2)     Status: None (Preliminary result)   Collection Time: 01/13/14 10:17 AM  Result Value Ref Range Status   Specimen Description BLOOD RIGHT HAND  Final   Special Requests BOTTLES DRAWN AEROBIC AND ANAEROBIC 5CC  Final   Culture NO GROWTH 2 DAYS   Final   Report Status PENDING  Incomplete  Culture, respiratory (NON-Expectorated)     Status: None (Preliminary result)   Collection Time: 01/15/14  4:02 PM  Result Value Ref Range Status   Specimen Description ENDOTRACHEAL  Final   Special Requests NONE  Final   Gram Stain   Final    MODERATE WBC PRESENT,BOTH PMN AND MONONUCLEAR NO SQUAMOUS EPITHELIAL CELLS SEEN NO ORGANISMS SEEN Performed at [facility]    Culture NO GROWTH Performed at [facility]   Final   Report Status PENDING  Incomplete    Assessment: 23 year old male [facility]14 for pneumonia with progressive worsening of respiratory status and [facility].  Patient is on day #4 of vancomycin, Zosyn, and Levaquin. SCr was 0.99 originally upon transfer, but has now trended up to 1.59, est CrCl ~7170mL/min.  PCT 1, LA 1. Tmax/24h 101, WBC wnl.  CXR this morning showed some improvement since yesterday.  Goal of Therapy:  Vancomycin trough level 15-20 mcg/ml  Clinical resolution of infection.  Plan:  1. Continue Zosyn 3.375g IV q8h- each dose over 4 hours 2. Continue Levaquin 750mg  IV q24h  3. Change vancomycin to 1000mg  IV q12h starting at 1700 tonight  4. Follow-up renal function, clinical status, and culture results 5. Follow for ability to de-escalate antibiotics   Ramon Brant D. Kayston Jodoin, PharmD, BCPS Clinical Pharmacist Pager: [phone] 01/16/2014 8:54 AM

## 2014-01-16 NOTE — Progress Notes (Signed)
Pt projectile vomited at 1940.  This was the second time today.  Elink notified, TF turned off, zofran given, pt denied abd tenderness, BS active, ABD soft but rounded.  Will continue to monitor.

## 2014-01-16 NOTE — Progress Notes (Signed)
PULMONARY / CRITICAL CARE MEDICINE   Name: Omar Rodriguez MRN: [id number] DOB: 05/08/1990    ADMISSION DATE:  01/12/2014  REFERRING MD :  Ardyth HarpsHernandez   CHIEF COMPLAINT:  Respiratory failure, CAP   INITIAL PRESENTATION:  23 yo male presented to [facility] with fever, chills, productive cough, nausea with vomiting for 6 days from PNA with hypoxic respiratory failure.  [facility].  STUDIES:  11/30 CT chest >> L>R multifocal infiltrates, small bilat effusions   SIGNIFICANT EVENTS: 11/29 admit [place], CAP, sepsis  12/02 tx [place] with worsening hypoxic resp failure   SUBJECTIVE:  Remains on increased PEEP, FiO2.  Denies chest/abd pain.  VITAL SIGNS: Temp:  [97.8 F (36.6 C)-101 F (38.3 C)] 98.2 F (36.8 C) (12/03 0416) Pulse Rate:  [64-120] 106 (12/03 0700) Resp:  [14-29] 17 (12/03 0700) BP: (104-162)/(35-96) 113/64 mmHg (12/03 0700) SpO2:  [88 %-100 %] 97 % (12/03 0700) FiO2 (%):  [60 %-100 %] 60 % (12/03 0533) Weight:  [160 lb (72.576 kg)-167 lb 8.8 oz (76 kg)] 167 lb 8.8 oz (76 kg) (12/03 0433) VENTILATOR SETTINGS: Vent Mode:  [-] PRVC FiO2 (%):  [60 %-100 %] 60 % Set Rate:  [16 bmp] 16 bmp Vt Set:  [500 mL] 500 mL PEEP:  [5 cmH20-10 cmH20] 10 cmH20 Plateau Pressure:  [18 cmH20-25 cmH20] 22 cmH20 INTAKE / OUTPUT:  Intake/Output Summary (Last 24 hours) at 01/16/14 0741 Last data filed at 01/16/14 0513  Gross per 24 hour  Intake 3599.41 ml  Output   2160 ml  Net 1439.41 ml    PHYSICAL EXAMINATION: General: no distress Neuro: RASS 0, normal strength HEENT: ETT in place Cardiovascular: regular Lungs: b/l rhonchi Abdomen: soft, non tender Musculoskeletal: no edema Skin: no rashes   LABS:  CBC  Recent Labs Lab 01/15/14 0823 01/15/14 2123 01/16/14 0425  WBC 4.9 4.9 4.7  HGB 12.8* 12.3* 11.8*  HCT 36.9* 36.5* 36.3*  PLT 147* 156 158   BMET  Recent Labs Lab 01/15/14 0823 01/15/14 2123 01/16/14 0425  NA 134* 134* 136*   K 4.0 4.5 4.4  CL 97 95* 98  CO2 26 27 25   BUN 9 13 17   CREATININE 0.99 1.34 1.59*  GLUCOSE 135* 106* 119*   Electrolytes  Recent Labs Lab 01/13/14 0728 01/15/14 0823 01/15/14 2123 01/16/14 0425  CALCIUM 7.5*  7.5* 7.5* 7.8* 7.8*  MG  --   --   --  2.4  PHOS 2.6  --   --  3.3   Sepsis Markers  Recent Labs Lab 01/13/14 0728 01/15/14 1201 01/15/14 1230 01/16/14 0425  LATICACIDVEN 1.3  --  1.0  --   PROCALCITON  --  0.93  --  1.00   ABG  Recent Labs Lab 01/15/14 1023 01/15/14 1456 01/16/14 0500  PHART 7.409 7.425 7.378  PCO2ART 44.5 43.4 45.9*  PO2ART 84.4 98.0 91.5   Liver Enzymes  Recent Labs Lab 01/12/14 1327 01/13/14 0728  AST 63* 52*  ALT 50 39  ALKPHOS 73 65  BILITOT 2.0* 1.2  ALBUMIN 3.3* 2.4*   Cardiac Enzymes  Recent Labs Lab 01/13/14 0728  PROBNP 691.1*   Glucose  Recent Labs Lab 01/15/14 1202 01/15/14 1544 01/15/14 1923 01/16/14 0020 01/16/14 0419  GLUCAP 90 111* 89 129* 109*    Imaging Dg Chest Port 1 View  01/15/2014   CLINICAL DATA:  Central line placement. Endotracheal tube placement.  EXAM: PORTABLE CHEST - 1 VIEW  COMPARISON:  Earlier  today at 0900 hr  FINDINGS: 1346 hr. New right internal jugular line, terminating at mid SVC. No pneumothorax.  Nasogastric tube extends beyond the inferior aspect of the film. An endotracheal tube terminates 4.4 cm above carina.  Normal heart size for level of inspiration. Moderate interstitial edema, improved. Airspace disease throughout the inferior left hemi thorax is improved. Probable decreased left pleural effusion.  IMPRESSION: Appropriate position of endotracheal and right internal jugular lines, without pneumothorax.  Improved aeration, likely due to pulmonary edema and possible infection.  Suspicion of a persistent but decreased small left pleural effusion.   Electronically Signed   By: Jeronimo GreavesKyle  Talbot M.D.   On: 01/15/2014 14:10   Dg Chest Port 1 View  01/15/2014   CLINICAL DATA:   Respiratory failure.  Pneumonia.  EXAM: PORTABLE CHEST - 1 VIEW  COMPARISON:  01/14/2014.  FINDINGS: Lung volumes are lower than on the prior examination. Bilateral left-greater-than-right airspace disease remains present compatible with multilobar/multifocal pneumonia. Increasing density is present at the LEFT lung base extending to the retrocardiac region, probably representing a combination of airspace disease, atelectasis and parapneumonic effusion.  Monitoring leads project over the chest. Grossly, the cardiopericardial silhouette appears unchanged.  IMPRESSION: Lower lung volumes with bilateral left-greater-than-right diffuse airspace disease. Worsening aeration at the LEFT base is probably due to a combination of lower volumes and increasing atelectasis.   Electronically Signed   By: Andreas NewportGeoffrey  Lamke M.D.   On: 01/15/2014 09:24   Dg Abd Portable 1v  01/15/2014   CLINICAL DATA:  Encounter for nasogastric tube placement.  EXAM: PORTABLE ABDOMEN - 1 VIEW  COMPARISON:  None.  FINDINGS: The bowel gas pattern is normal. Nasogastric tube tip is seen in expected position of distal stomach or proximal duodenum. No radio-opaque calculi or other significant radiographic abnormality are seen.  IMPRESSION: Nasogastric tube tip seen in expected position of distal stomach or proximal duodenum.   Electronically Signed   By: Roque LiasJames  Green M.D.   On: 01/15/2014 14:12    ASSESSMENT / PLAN:  PULMONARY ETT 12/2 >> A: Acute hypoxic respiratory failure 2nd to PNA. P:   Full vent support F/u CXR Wean PEEP/FiO2 to keep SpO2 > 92%  CARDIOVASCULAR Rt IJ CVL 12/02 >> A: Severe sepsis 2nd to PNA. P:  Monitor hemodynamics  RENAL A: Acute kidney injury 2nd to sepsis. P:   Optimize volume status Monitor renal fx, urine outpt, electrolytes  GASTROINTESTINAL A: Nutrition. P:   Tube feeds while on vent Protonix for SUP  HEMATOLOGIC A: No acute issues. P:  F/u CBC Lovenox for DVT  prevention  INFECTIOUS A: CAP P:   Day 5 of Abx, currently on levaquin/zosyn/vancomycin  Blood 11/30 >> Urine 12/02 >> Sputum 12/02 >> Respiratory viral panel 12/02 >>  ENDOCRINE A: No acute issues. P:   Monitor glucose on BMET  NEUROLOGIC A: Sedation. P:   RASS goal: -1  FAMILY  - Updates:  Parents updated at bedside 12/2  SUMMARY: Hemodynamics and respiratory mechanics improved.  Still needs increase PEEP/FiO2 >> defer vent weaning for now.  CC time 40 minutes.  Coralyn HellingVineet Liandra Mendia, MD [facility] Pulmonary/Critical Care 01/16/2014, 7:50 AM Pager:  [phone] After 3pm call: [phone]

## 2014-01-16 NOTE — Progress Notes (Signed)
RT suctioned patient for rhonchi bilaterally and pt request to be suctioned. Pt started gagging and right nare started bleeding, subsequently pt began to vomit around ETT. Pt vomited copious amounts, RT and RN suctioned oral airway. RN aware and cleaning pt now.

## 2014-01-17 ENCOUNTER — Inpatient Hospital Stay (HOSPITAL_COMMUNITY): Payer: Medicaid - Out of State

## 2014-01-17 LAB — RESPIRATORY VIRUS PANEL
ADENOVIRUS: NOT DETECTED
INFLUENZA A: NOT DETECTED
INFLUENZA B 1: NOT DETECTED
Influenza A H1: NOT DETECTED
Influenza A H3: NOT DETECTED
Metapneumovirus: NOT DETECTED
PARAINFLUENZA 1 A: NOT DETECTED
PARAINFLUENZA 2 A: NOT DETECTED
Parainfluenza 3: NOT DETECTED
RESPIRATORY SYNCYTIAL VIRUS B: NOT DETECTED
Respiratory Syncytial Virus A: NOT DETECTED
Rhinovirus: NOT DETECTED

## 2014-01-17 LAB — BASIC METABOLIC PANEL
ANION GAP: 13 (ref 5–15)
BUN: 18 mg/dL (ref 6–23)
CHLORIDE: 101 meq/L (ref 96–112)
CO2: 27 mEq/L (ref 19–32)
Calcium: 7.8 mg/dL — ABNORMAL LOW (ref 8.4–10.5)
Creatinine, Ser: 1.53 mg/dL — ABNORMAL HIGH (ref 0.50–1.35)
GFR calc non Af Amer: 62 mL/min — ABNORMAL LOW (ref >90)
GFR, EST AFRICAN AMERICAN: 72 mL/min — AB (ref >90)
Glucose, Bld: 94 mg/dL (ref 70–99)
POTASSIUM: 4.5 meq/L (ref 3.7–5.3)
Sodium: 141 mEq/L (ref 137–147)

## 2014-01-17 LAB — CULTURE, RESPIRATORY W GRAM STAIN

## 2014-01-17 LAB — CBC
HCT: 36 % — ABNORMAL LOW (ref 39.0–52.0)
HEMOGLOBIN: 11.6 g/dL — AB (ref 13.0–17.0)
MCH: 28.6 pg (ref 26.0–34.0)
MCHC: 32.2 g/dL (ref 30.0–36.0)
MCV: 88.7 fL (ref 78.0–100.0)
Platelets: 181 10*3/uL (ref 150–400)
RBC: 4.06 MIL/uL — ABNORMAL LOW (ref 4.22–5.81)
RDW: 13.4 % (ref 11.5–15.5)
WBC: 4.9 10*3/uL (ref 4.0–10.5)

## 2014-01-17 LAB — GLUCOSE, CAPILLARY
GLUCOSE-CAPILLARY: 102 mg/dL — AB (ref 70–99)
GLUCOSE-CAPILLARY: 112 mg/dL — AB (ref 70–99)
GLUCOSE-CAPILLARY: 107 mg/dL — AB (ref 70–99)
Glucose-Capillary: 97 mg/dL (ref 70–99)
Glucose-Capillary: 88 mg/dL (ref 70–99)
Glucose-Capillary: 88 mg/dL (ref 70–99)

## 2014-01-17 LAB — CULTURE, RESPIRATORY

## 2014-01-17 MED ORDER — METOCLOPRAMIDE HCL 5 MG/ML IJ SOLN
5.0000 mg | Freq: Four times a day (QID) | INTRAMUSCULAR | Status: DC
  Administered 2014-01-17 – 2014-01-20 (×12): 5 mg via INTRAVENOUS
  Filled 2014-01-17: qty 2
  Filled 2014-01-17 (×15): qty 1
  Filled 2014-01-17: qty 2
  Filled 2014-01-17: qty 1

## 2014-01-17 MED ORDER — VITAL AF 1.2 CAL PO LIQD
1000.0000 mL | ORAL | Status: DC
  Administered 2014-01-17: 1000 mL
  Filled 2014-01-17 (×2): qty 1000

## 2014-01-17 NOTE — Progress Notes (Signed)
Attempted to suction pt, pt began to shake head no that he didn't want suctioning. RN at bedside.

## 2014-01-17 NOTE — Plan of Care (Signed)
Problem: Phase I Progression Outcomes Goal: VTE prophylaxis Outcome: Completed/Met Date Met:  01/17/14 Goal: GIProphysixis Outcome: Completed/Met Date Met:  01/17/14 Goal: Oral Care per Protocol Outcome: Completed/Met Date Met:  01/17/14 Goal: HOB elevated 30 degrees Outcome: Completed/Met Date Met:  01/17/14 Goal: VAP prevention protocol initiated Outcome: Completed/Met Date Met:  01/17/14 Goal: Sedation Protocol initiated if indicated Outcome: Completed/Met Date Met:  01/17/14 Goal: Pneumonia/flu vaccination screen completed Outcome: Completed/Met Date Met:  01/17/14 Goal: Initiate hyperglycemia protocol as indicated Outcome: Completed/Met Date Met:  01/17/14 Goal: ARDS Protocol initiated if indicated Outcome: Not Applicable Date Met:  17/51/02 Goal: Sepsis Protocol initiated if indicated Outcome: Not Applicable Date Met:  58/52/77 Goal: Code status addressed with pt/family Outcome: Completed/Met Date Met:  01/17/14 Goal: Pain controlled with appropriate interventions Outcome: Completed/Met Date Met:  01/17/14 Goal: Hemodynamically stable Outcome: Completed/Met Date Met:  01/17/14 Goal: Baseline oxygen/pH stable Outcome: Progressing Goal: Patient tolerating nututrition at goal Outcome: Progressing

## 2014-01-17 NOTE — Progress Notes (Signed)
Pt placed back on full support after patient started gagging on ETT and vomiting what looked to be tube feed. Pt is now comfortable. RN at bedside.

## 2014-01-17 NOTE — Progress Notes (Signed)
PULMONARY / CRITICAL CARE MEDICINE   Name: Omar Rodriguez MRN: [id number] DOB: 01/07/1991    ADMISSION DATE:  01/12/2014  REFERRING MD :  Ardyth HarpsHernandez   CHIEF COMPLAINT:  Respiratory failure, CAP   INITIAL PRESENTATION:  23 yo male presented to [facility] with fever, chills, productive cough, nausea with vomiting for 6 days from PNA with hypoxic respiratory failure.  [facility]  STUDIES:  11/30 CT chest >> L>R multifocal infiltrates, small bilat effusions   SIGNIFICANT EVENTS: 11/29 admit [place], CAP, sepsis  12/02 tx [place] with worsening hypoxic resp failure  12/03 vomiting tube feeds  SUBJECTIVE:  O2 needs decreased.  VITAL SIGNS: Temp:  [97.2 F (36.2 C)-98.4 F (36.9 C)] 97.8 F (36.6 C) (12/04 0900) Pulse Rate:  [86-105] 92 (12/04 0900) Resp:  [15-31] 18 (12/04 0900) BP: (102-127)/(57-79) 113/68 mmHg (12/04 0900) SpO2:  [91 %-100 %] 95 % (12/04 0900) FiO2 (%):  [40 %-50 %] 50 % (12/04 0800) Weight:  [164 lb 14.5 oz (74.8 kg)] 164 lb 14.5 oz (74.8 kg) (12/04 0441) VENTILATOR SETTINGS: Vent Mode:  [-] PRVC FiO2 (%):  [40 %-50 %] 50 % Set Rate:  [16 bmp] 16 bmp Vt Set:  [500 mL] 500 mL PEEP:  [8 cmH20] 8 cmH20 Plateau Pressure:  [21 cmH20-22 cmH20] 22 cmH20 INTAKE / OUTPUT:  Intake/Output Summary (Last 24 hours) at 01/17/14 0946 Last data filed at 01/17/14 0900  Gross per 24 hour  Intake 3524.84 ml  Output   2695 ml  Net 829.84 ml    PHYSICAL EXAMINATION: General: no distress Neuro: RASS 0, normal strength HEENT: ETT in place Cardiovascular: regular Lungs: bronchial breath sounds/rales LT lung Abdomen: soft, non tender Musculoskeletal: no edema Skin: no rashes   LABS:  CBC  Recent Labs Lab 01/15/14 2123 01/16/14 0425 01/17/14 0430  WBC 4.9 4.7 4.9  HGB 12.3* 11.8* 11.6*  HCT 36.5* 36.3* 36.0*  PLT 156 158 181   BMET  Recent Labs Lab 01/15/14 2123 01/16/14 0425 01/17/14 0430  NA 134* 136* 141  K 4.5 4.4  4.5  CL 95* 98 101  CO2 27 25 27   BUN 13 17 18   CREATININE 1.34 1.59* 1.53*  GLUCOSE 106* 119* 94   Electrolytes  Recent Labs Lab 01/13/14 0728  01/15/14 2123 01/16/14 0425 01/17/14 0430  CALCIUM 7.5*  7.5*  < > 7.8* 7.8* 7.8*  MG  --   --   --  2.4  --   PHOS 2.6  --   --  3.3  --   < > = values in this interval not displayed. Sepsis Markers  Recent Labs Lab 01/13/14 0728 01/15/14 1201 01/15/14 1230 01/16/14 0425  LATICACIDVEN 1.3  --  1.0  --   PROCALCITON  --  0.93  --  1.00   ABG  Recent Labs Lab 01/15/14 1023 01/15/14 1456 01/16/14 0500  PHART 7.409 7.425 7.378  PCO2ART 44.5 43.4 45.9*  PO2ART 84.4 98.0 91.5   Liver Enzymes  Recent Labs Lab 01/12/14 1327 01/13/14 0728  AST 63* 52*  ALT 50 39  ALKPHOS 73 65  BILITOT 2.0* 1.2  ALBUMIN 3.3* 2.4*   Cardiac Enzymes  Recent Labs Lab 01/13/14 0728  PROBNP 691.1*   Glucose  Recent Labs Lab 01/16/14 1142 01/16/14 1548 01/16/14 1908 01/16/14 2328 01/17/14 0431 01/17/14 0743  GLUCAP 136* 120* 116* 102* 97 88    Imaging Dg Chest Port 1 View  01/16/2014   CLINICAL DATA:  Respiratory failure and community-acquired pneumonia, intubated patient  EXAM: PORTABLE CHEST - 1 VIEW  COMPARISON:  Portable chest x-ray of January 15, 2014  FINDINGS: The right lung is well-expanded. Mildly increased interstitial densities persist. On the left there are diffuse alveolar opacities. The hemidiaphragm is slightly better demonstrated today. The cardiac silhouette is normal in size. The pulmonary vascularity is not engorged.  The endotracheal tube tip lies 2.9 cm above the crotch of the carina. The esophagogastric tube tip projects below the inferior margin of the image. The right internal jugular venous catheter tip projects over the mid portion of the SVC.  IMPRESSION: The appearance of both lungs has improved somewhat since yesterday's study. Diffuse alveolar opacities persist on the left consistent with pneumonia.  Coarse interstitial densities persist on the right. The support tubes and lines are in appropriate position.   Electronically Signed   By: David  Swazil[place]Jordan   On: 01/16/2014 07:02    ASSESSMENT / PLAN:  PULMONARY ETT 12/2 >> A: Acute hypoxic respiratory failure 2nd to PNA. P:   Pressure support wean as tolerated >> not ready for extubation yet F/u CXR Wean PEEP/FiO2 to keep SpO2 > 92%  CARDIOVASCULAR Rt IJ CVL 12/02 >> A: Severe sepsis 2nd to PNA. P:  Monitor hemodynamics  RENAL A: Acute kidney injury 2nd to sepsis. P:   Optimize volume status Monitor renal fx, urine outpt, electrolytes  GASTROINTESTINAL A: Nutrition. Nausea with vomiting 12/03 >> ? If related to high tube feed rate >> was getting 75 ml/hr. P:   Resume tube feeds at 30 ml/hr >> if he has recurrent vomiting, then options are to add reglan or have tube placed post-pyloric Protonix for SUP  HEMATOLOGIC A: No acute issues. P:  F/u CBC Lovenox for DVT prevention  INFECTIOUS A: CAP P:   Day 6 of Abx, currently on zosyn Will d/c levaquin, vancomycin 12/04  Blood 11/30 >> Respiratory viral panel 12/02 >>  ENDOCRINE A: No acute issues. P:   Monitor glucose on BMET  NEUROLOGIC A: Sedation. P:   RASS goal: -1  FAMILY  - Updates:  Parents updated at bedside 12/3  SUMMARY: Oxygen needs improved.  Will start pressure support weaning >> not ready for extubation.    CC time 35 minutes.  Coralyn HellingVineet Lady Wisham, MD [facility] Pulmonary/Critical Care 01/17/2014, 9:46 AM Pager:  [phone] After 3pm call: [phone]

## 2014-01-17 NOTE — Progress Notes (Signed)
Attempted Percussion therapy through the bed. Patient started complaining so therapy stopped.

## 2014-01-18 ENCOUNTER — Inpatient Hospital Stay (HOSPITAL_COMMUNITY): Payer: Medicaid - Out of State

## 2014-01-18 LAB — GLUCOSE, CAPILLARY
GLUCOSE-CAPILLARY: 76 mg/dL (ref 70–99)
Glucose-Capillary: 101 mg/dL — ABNORMAL HIGH (ref 70–99)
Glucose-Capillary: 98 mg/dL (ref 70–99)
Glucose-Capillary: 91 mg/dL (ref 70–99)
Glucose-Capillary: 96 mg/dL (ref 70–99)

## 2014-01-18 LAB — BASIC METABOLIC PANEL
Anion gap: 10 (ref 5–15)
BUN: 19 mg/dL (ref 6–23)
CO2: 28 mEq/L (ref 19–32)
CREATININE: 1.56 mg/dL — AB (ref 0.50–1.35)
Calcium: 8 mg/dL — ABNORMAL LOW (ref 8.4–10.5)
Chloride: 102 mEq/L (ref 96–112)
GFR, EST AFRICAN AMERICAN: 70 mL/min — AB (ref >90)
GFR, EST NON AFRICAN AMERICAN: 61 mL/min — AB (ref >90)
Glucose, Bld: 98 mg/dL (ref 70–99)
Potassium: 4.3 mEq/L (ref 3.7–5.3)
Sodium: 140 mEq/L (ref 137–147)

## 2014-01-18 LAB — CBC
HCT: 35.9 % — ABNORMAL LOW (ref 39.0–52.0)
Hemoglobin: 11.5 g/dL — ABNORMAL LOW (ref 13.0–17.0)
MCH: 28.7 pg (ref 26.0–34.0)
MCHC: 32 g/dL (ref 30.0–36.0)
MCV: 89.5 fL (ref 78.0–100.0)
PLATELETS: 243 10*3/uL (ref 150–400)
RBC: 4.01 MIL/uL — ABNORMAL LOW (ref 4.22–5.81)
RDW: 13.5 % (ref 11.5–15.5)
WBC: 6.7 10*3/uL (ref 4.0–10.5)

## 2014-01-18 LAB — CULTURE, BLOOD (ROUTINE X 2)
CULTURE: NO GROWTH
Culture: NO GROWTH

## 2014-01-18 MED ORDER — SODIUM CHLORIDE 0.9 % IV SOLN
INTRAVENOUS | Status: DC
  Administered 2014-01-18: 19:00:00 via INTRAVENOUS

## 2014-01-18 MED ORDER — FREE WATER
200.0000 mL | Freq: Three times a day (TID) | Status: DC
  Administered 2014-01-18 (×2): 200 mL

## 2014-01-18 NOTE — Progress Notes (Signed)
influenza negative per PCR and resp virus panel- droplet precaution d/c'd Marvia PicklesJames, Daila Elbert Sara, RN

## 2014-01-18 NOTE — Progress Notes (Addendum)
Decreased PEEP to 5 with FIO2 of 40% per SPO2 95%. Will attempt to wean patient if tolerates vent changes. Pt also refused to have ETT moved or to be suctioned at this time. Explained why these needed to be done. Pt refused.

## 2014-01-18 NOTE — Progress Notes (Signed)
PULMONARY / CRITICAL CARE MEDICINE   Name: Omar Rodriguez MRN: [id number] DOB: 01/23/1991    ADMISSION DATE:  01/12/2014  REFERRING MD :  Ardyth HarpsHernandez   CHIEF COMPLAINT:  Respiratory failure, CAP   INITIAL PRESENTATION:  23 yo male presented to [facility] with fever, chills, productive cough, nausea with vomiting for 6 days from PNA with hypoxic respiratory failure.  [facility].  STUDIES:  11/30 CT chest >> L>R multifocal infiltrates, small bilat effusions  12/4 gagging, vomiting with transient increase in FIO2/PEEP  SIGNIFICANT EVENTS: 11/29 admit [place], CAP, sepsis  12/02 tx [place] with worsening hypoxic resp failure  12/03 vomiting tube feeds  SUBJECTIVE:  O2 needs back down to 40/+5 Afebrile Very anxious Does not want to be suctioned  VITAL SIGNS: Temp:  [97.9 F (36.6 C)-98.3 F (36.8 C)] 98.2 F (36.8 C) (12/05 0402) Pulse Rate:  [87-113] 87 (12/05 0900) Resp:  [16-29] 16 (12/05 0900) BP: (106-135)/(61-95) 120/79 mmHg (12/05 0600) SpO2:  [87 %-99 %] 96 % (12/05 0900) FiO2 (%):  [40 %-80 %] 40 % (12/05 0811) Weight:  [77.1 kg (169 lb 15.6 oz)] 77.1 kg (169 lb 15.6 oz) (12/05 0412) VENTILATOR SETTINGS: Vent Mode:  [-] PRVC FiO2 (%):  [40 %-80 %] 40 % Set Rate:  [16 bmp] 16 bmp Vt Set:  [500 mL] 500 mL PEEP:  [5 cmH20-10 cmH20] 5 cmH20 Pressure Support:  [5 cmH20] 5 cmH20 Plateau Pressure:  [18 cmH20-23 cmH20] 23 cmH20 INTAKE / OUTPUT:  Intake/Output Summary (Last 24 hours) at 01/18/14 0940 Last data filed at 01/18/14 0900  Gross per 24 hour  Intake 2761.92 ml  Output   2365 ml  Net 396.92 ml    PHYSICAL EXAMINATION: General: no distress, calm Neuro: RASS 0, normal strength HEENT: ETT in place Cardiovascular: regular Lungs: bronchial breath sounds/rales LT lung Abdomen: soft, non tender Musculoskeletal: no edema Skin: no rashes   LABS:  CBC  Recent Labs Lab 01/16/14 0425 01/17/14 0430 01/18/14 0500  WBC 4.7  4.9 6.7  HGB 11.8* 11.6* 11.5*  HCT 36.3* 36.0* 35.9*  PLT 158 181 243   BMET  Recent Labs Lab 01/16/14 0425 01/17/14 0430 01/18/14 0500  NA 136* 141 140  K 4.4 4.5 4.3  CL 98 101 102  CO2 25 27 28   BUN 17 18 19   CREATININE 1.59* 1.53* 1.56*  GLUCOSE 119* 94 98   Electrolytes  Recent Labs Lab 01/13/14 0728  01/16/14 0425 01/17/14 0430 01/18/14 0500  CALCIUM 7.5*  7.5*  < > 7.8* 7.8* 8.0*  MG  --   --  2.4  --   --   PHOS 2.6  --  3.3  --   --   < > = values in this interval not displayed. Sepsis Markers  Recent Labs Lab 01/13/14 0728 01/15/14 1201 01/15/14 1230 01/16/14 0425  LATICACIDVEN 1.3  --  1.0  --   PROCALCITON  --  0.93  --  1.00   ABG  Recent Labs Lab 01/15/14 1023 01/15/14 1456 01/16/14 0500  PHART 7.409 7.425 7.378  PCO2ART 44.5 43.4 45.9*  PO2ART 84.4 98.0 91.5   Liver Enzymes  Recent Labs Lab 01/12/14 1327 01/13/14 0728  AST 63* 52*  ALT 50 39  ALKPHOS 73 65  BILITOT 2.0* 1.2  ALBUMIN 3.3* 2.4*   Cardiac Enzymes  Recent Labs Lab 01/13/14 0728  PROBNP 691.1*   Glucose  Recent Labs Lab 01/17/14 0743 01/17/14 1155 01/17/14 1546  01/17/14 1908 01/18/14 0403 01/18/14 0716  GLUCAP 88 112* 88 107* 101* 98    Imaging Dg Chest Port 1 View  01/17/2014   CLINICAL DATA:  Hypoxia  EXAM: PORTABLE CHEST - 1 VIEW  COMPARISON:  January 16, 2014  FINDINGS: Endotracheal tube tip is 4.1 cm above the carina. Nasogastric tube tip and side port are below the diaphragm. Central catheter tip is in the superior vena cava. No pneumothorax. There is extensive consolidation throughout most of the left lung with small left effusion. Right lung is clear. Heart is upper normal in size with pulmonary vascularity within normal limits. No adenopathy.  IMPRESSION: Tube and catheter positions as described without pneumothorax. Persistent extensive consolidation on the left with small left effusion. Right lung clear. No change in cardiac silhouette.    Electronically Signed   By: Bretta BangWilliam  Woodruff M.D.   On: 01/17/2014 07:10   Dg Abd Portable 1v  01/17/2014   CLINICAL DATA:  Vomiting.  Evaluate OG tube positioning.  EXAM: PORTABLE ABDOMEN - 1 VIEW  COMPARISON:  01/15/2014  FINDINGS: Orogastric tube tip in the distal stomach region. Gas in the small bowel, large bowel and stomach. No large abdominal calcifications.  IMPRESSION: Orogastric tube in the distal stomach region.  Nonspecific bowel gas pattern.   Electronically Signed   By: Richarda OverlieAdam  Henn M.D.   On: 01/17/2014 11:43    ASSESSMENT / PLAN:  PULMONARY ETT 12/2 >> A: Acute hypoxic respiratory failure 2nd to PNA. P:   Pressure support wean as tolerated >> extubate if tolerates 5/5 F/u CXR Wean PEEP/FiO2 to keep SpO2 > 92%  CARDIOVASCULAR Rt IJ CVL 12/02 >> A: Severe sepsis 2nd to PNA. P:  Monitor hemodynamics  RENAL A: Acute kidney injury 2nd to sepsis. P:   Optimize volume status Monitor renal fx, urine outpt, electrolytes  GASTROINTESTINAL A: Nutrition. Nausea with vomiting 12/03 >> ? If related to high tube feed rate >> was getting 75 ml/hr. P:   Resume tube feeds at 30 ml/hr >> if he has recurrent vomiting, then options are to add reglan or have tube placed post-pyloric Protonix for SUP  HEMATOLOGIC A: No acute issues. P:  F/u CBC Lovenox for DVT prevention  INFECTIOUS A: CAP P:    zosyn  7/10 Will d/c levaquin, vancomycin 12/04  Blood 11/30 >>ng Respiratory viral panel 12/02 >>ng  ENDOCRINE A: No acute issues. P:   Monitor glucose on BMET  NEUROLOGIC A: Sedation. P:   RASS goal: -1  FAMILY  - Updates:  Parents updated at bedside 12/5  SUMMARY: Oxygen needs improved.  Will start pressure support weaning >> close to extubation.    The patient is critically ill with multiple organ systems failure and requires high complexity decision making for assessment and support, frequent evaluation and titration of therapies, application of advanced  monitoring technologies and extensive interpretation of multiple databases. Critical Care Time devoted to patient care services described in this note independent of APP time is 35 minutes.    Oretha MilchALVA,Bertie Simien V. MD   01/18/2014, 9:40 AM

## 2014-01-18 NOTE — Procedures (Signed)
Extubation Procedure Note  Patient Details:   Name: Jonna Coupony C Lacross DOB: 10/12/1990 MRN: [id number]   Airway Documentation:  Airway 7.5 mm (Active)  Secured at (cm) 25 cm 01/18/2014 12:06 PM  Measured From Lips 01/18/2014 12:06 PM  Secured Location Other (Comment) 01/18/2014 12:06 PM  Secured By [employer] Tube Holder 01/18/2014 12:06 PM  Tube Holder Repositioned Yes 01/18/2014 12:06 PM  Cuff Pressure (cm H2O) 24 cm H2O 01/18/2014  4:50 AM  Site Condition Dry 01/18/2014 12:06 PM    Evaluation  O2 sats: stable throughout Complications: No apparent complications Patient did tolerate procedure well. Bilateral Breath Sounds: Clear, Diminished Suctioning: Airway Yes  Pt was extubated per MD order. Pt was placed on 3 LPM nasal cannula. Pt had positive cuff leak. No stridor noted. Pt had a good strong cough. Pt was able to tell us name but very softly. BBS clear. RT will continue to monitor.   Ladarion Munyon M 01/18/2014, 2:15 PM

## 2014-01-18 NOTE — Progress Notes (Signed)
Called by RN that pt wanted ETT moved. Moved ETT from left to right side. Pt was not comfortable and wanted it moved back to the left. Pt then motioned that he felt sick and was going to vomit. Pt started gagging and then vomiting in ETT and out of mouth. Moderates amount of yellow secretions were suctioned from patients mouth and ETT. Pt soon after calmed down and said he was ok now. RT will continue to monitor.

## 2014-01-18 NOTE — Progress Notes (Signed)
Wasted 200 mcg iv fentanly , witnessed by [employer] McL[employer] Bridget Williford

## 2014-01-19 ENCOUNTER — Inpatient Hospital Stay (HOSPITAL_COMMUNITY): Payer: Medicaid - Out of State

## 2014-01-19 LAB — CBC
HCT: 36.6 % — ABNORMAL LOW (ref 39.0–52.0)
Hemoglobin: 12.3 g/dL — ABNORMAL LOW (ref 13.0–17.0)
MCH: 28.9 pg (ref 26.0–34.0)
MCHC: 33.6 g/dL (ref 30.0–36.0)
MCV: 85.9 fL (ref 78.0–100.0)
Platelets: 309 10*3/uL (ref 150–400)
RBC: 4.26 MIL/uL (ref 4.22–5.81)
RDW: 12.9 % (ref 11.5–15.5)
WBC: 9.4 10*3/uL (ref 4.0–10.5)

## 2014-01-19 LAB — BASIC METABOLIC PANEL
Anion gap: 15 (ref 5–15)
BUN: 19 mg/dL (ref 6–23)
CHLORIDE: 100 meq/L (ref 96–112)
CO2: 26 mEq/L (ref 19–32)
CREATININE: 1.64 mg/dL — AB (ref 0.50–1.35)
Calcium: 8.4 mg/dL (ref 8.4–10.5)
GFR calc Af Amer: 66 mL/min — ABNORMAL LOW (ref >90)
GFR calc non Af Amer: 57 mL/min — ABNORMAL LOW (ref >90)
GLUCOSE: 82 mg/dL (ref 70–99)
Potassium: 4 mEq/L (ref 3.7–5.3)
Sodium: 141 mEq/L (ref 137–147)

## 2014-01-19 LAB — GLUCOSE, CAPILLARY
Glucose-Capillary: 86 mg/dL (ref 70–99)
Glucose-Capillary: 87 mg/dL (ref 70–99)
Glucose-Capillary: 97 mg/dL (ref 70–99)

## 2014-01-19 MED ORDER — PANTOPRAZOLE SODIUM 40 MG PO TBEC
40.0000 mg | DELAYED_RELEASE_TABLET | Freq: Every day | ORAL | Status: DC
  Administered 2014-01-19 – 2014-01-20 (×2): 40 mg via ORAL
  Filled 2014-01-19 (×2): qty 1

## 2014-01-19 NOTE — Progress Notes (Signed)
PULMONARY / CRITICAL CARE MEDICINE   Name: Omar Rodriguez MRN: [id number] DOB: 09/25/1990    ADMISSION DATE:  01/12/2014  REFERRING MD :  Ardyth HarpsHernandez   CHIEF COMPLAINT:  Respiratory failure, CAP   INITIAL PRESENTATION:  23 yo male presented to [facility] with fever, chills, productive cough, nausea with vomiting for 6 days from PNA with hypoxic respiratory failure.  [facility].  STUDIES:  11/30 CT chest >> L>R multifocal infiltrates, small bilat effusions  12/4 gagging, vomiting with transient increase in FIO2/PEEP  SIGNIFICANT EVENTS: 11/29 admit , CAP, sepsis  12/02 tx [place] with worsening hypoxic resp failure  12/03 vomiting tube feeds  SUBJECTIVE:  Childish but nad  VITAL SIGNS: Temp:  [98.1 F (36.7 C)-99.9 F (37.7 C)] 98.6 F (37 C) (12/06 0709) Pulse Rate:  [91-121] 111 (12/06 0500) Resp:  [12-33] 20 (12/06 0500) BP: (104-134)/(45-99) 117/45 mmHg (12/06 0500) SpO2:  [92 %-97 %] 96 % (12/06 0500) FiO2 (%):  [40 %] 40 % (12/05 1242) Weight:  [160 lb 7.9 oz (72.8 kg)] 160 lb 7.9 oz (72.8 kg) (12/06 0004) VENTILATOR SETTINGS: Vent Mode:  [-] PSV;CPAP FiO2 (%):  [40 %] 40 % PEEP:  [5 cmH20] 5 cmH20 Pressure Support:  [5 cmH20-10 cmH20] 5 cmH20 INTAKE / OUTPUT:  Intake/Output Summary (Last 24 hours) at 01/19/14 0918 Last data filed at 01/19/14 0800  Gross per 24 hour  Intake 364.17 ml  Output   2800 ml  Net -2435.83 ml    PHYSICAL EXAMINATION: General: no distress, calm. Speech clear Neuro: RASS 1, normal strength HEENT: No JVD/LAN Cardiovascular: regular Lungs: decreased bs Abdomen: soft, non tender+bs Musculoskeletal: no edema Skin: no rashes   LABS:  CBC  Recent Labs Lab 01/17/14 0430 01/18/14 0500 01/19/14 0530  WBC 4.9 6.7 9.4  HGB 11.6* 11.5* 12.3*  HCT 36.0* 35.9* 36.6*  PLT 181 243 309   BMET  Recent Labs Lab 01/17/14 0430 01/18/14 0500 01/19/14 0530  NA 141 140 141  K 4.5 4.3 4.0  CL 101  102 100  CO2 27 28 26   BUN 18 19 19   CREATININE 1.53* 1.56* 1.64*  GLUCOSE 94 98 82   Electrolytes  Recent Labs Lab 01/13/14 0728  01/16/14 0425 01/17/14 0430 01/18/14 0500 01/19/14 0530  CALCIUM 7.5*  7.5*  < > 7.8* 7.8* 8.0* 8.4  MG  --   --  2.4  --   --   --   PHOS 2.6  --  3.3  --   --   --   < > = values in this interval not displayed. Sepsis Markers  Recent Labs Lab 01/13/14 0728 01/15/14 1201 01/15/14 1230 01/16/14 0425  LATICACIDVEN 1.3  --  1.0  --   PROCALCITON  --  0.93  --  1.00   ABG  Recent Labs Lab 01/15/14 1023 01/15/14 1456 01/16/14 0500  PHART 7.409 7.425 7.378  PCO2ART 44.5 43.4 45.9*  PO2ART 84.4 98.0 91.5   Liver Enzymes  Recent Labs Lab 01/12/14 1327 01/13/14 0728  AST 63* 52*  ALT 50 39  ALKPHOS 73 65  BILITOT 2.0* 1.2  ALBUMIN 3.3* 2.4*   Cardiac Enzymes  Recent Labs Lab 01/13/14 0728  PROBNP 691.1*   Glucose  Recent Labs Lab 01/18/14 0403 01/18/14 0716 01/18/14 1536 01/18/14 1945 01/19/14 0002 01/19/14 0349  GLUCAP 101* 98 91 76 86 87    Imaging Dg Chest Port 1 View  01/18/2014   CLINICAL DATA:  Community acquired pneumonia  EXAM: PORTABLE CHEST - 1 VIEW  COMPARISON:  Yesterday  FINDINGS: Stable endotracheal and NG tubes. Stable right internal jugular central venous catheter. Vascular congestion has worsened in the right lung. Consolidation throughout the left lung is not significantly changed.  IMPRESSION: Stable consolidation throughout the left lung. Worsening vascular congestion.   Electronically Signed   By: Maryclare BeanArt  Hoss M.D.   On: 01/18/2014 08:25    ASSESSMENT / PLAN:  PULMONARY ETT 12/2 >>12/5 A: Acute hypoxic respiratory failure 2nd to PNA. P:   O2 as needed, IS F/u CXR   CARDIOVASCULAR Rt IJ CVL 12/02 >> A: Severe sepsis 2nd to PNA. P:  Monitor hemodynamics  RENAL Lab Results  Component Value Date   CREATININE 1.64* 01/19/2014   CREATININE 1.56* 01/18/2014   CREATININE 1.53*  01/17/2014    A: Acute kidney injury 2nd to sepsis. P:   Optimize volume status Monitor renal fx, urine outpt, electrolytes  GASTROINTESTINAL A: Nutrition. Nausea with vomiting 12/03 >> ? If related to high tube feed rate >> was getting 75 ml/hr. P:   12/6 start diet  HEMATOLOGIC A: No acute issues. P:  F/u CBC Lovenox for DVT prevention  INFECTIOUS A: CAP P:    zosyn  8/10 Will d/c levaquin, vancomycin 12/04  Blood 11/30 >>ng Respiratory viral panel 12/02 >>ng  ENDOCRINE A: No acute issues. P:   Monitor glucose on BMET  NEUROLOGIC A: Follows commands, strange affect P:   RASS goal: 1 Hold all sedation  FAMILY  - Updates:  Parents updated at bedside 12/6   [facility] ACNP [facility] Pager [phone] till 3 pm If no answer page [phone]  SUMMARY: CAP with ARDS, extubated & improving. He does have ADHD (not on meds) . Mobilise, advance diet & tr out of ICU   Care during the described time interval was provided by me and/or other providers on the critical care team.  I have reviewed this patient's available data, including medical history, events of note, physical examination and test results as part of my evaluation  My cc time x 4077m   Cyril Mourningakesh Kathlynn Swofford MD. Tonny BollmanFCCP. [facility] Pager [phone] If no response call [phone]     01/19/2014, 9:19 AM

## 2014-01-19 NOTE — Progress Notes (Signed)
Report called to RN. All questions answered. Pt stable and alert and oriented at time of transfer.

## 2014-01-19 NOTE — Plan of Care (Signed)
Problem: Phase I Progression Outcomes Goal: OOB as tolerated unless otherwise ordered Outcome: Completed/Met Date Met:  01/19/14 Goal: First antibiotic given within 6hrs of admit Outcome: Completed/Met Date Met:  01/19/14 Goal: Initial discharge plan identified Outcome: Completed/Met Date Met:  01/19/14  Problem: Phase II Progression Outcomes Goal: Wean O2 if indicated Outcome: Completed/Met Date Met:  01/19/14 Goal: Progress activity as tolerated unless otherwise ordered Outcome: Completed/Met Date Met:  01/19/14

## 2014-01-19 NOTE — Progress Notes (Signed)
Attempted to call report to [phone]. They will call back when they have assigned RN.

## 2014-01-19 NOTE — Progress Notes (Signed)
ANTIBIOTIC CONSULT NOTE - FOLLOW UP  Pharmacy Consult for  Zosyn Indication: pneumonia  Allergies  Allergen Reactions  . Ceclor [Cefaclor] Hives    Patient Measurements: Height: _0  (172.7 cm) Weight: 160 lb 7.9 oz (72.8 kg) IBW/kg (Calculated) : 68.4  Vital Signs: Temp: 98.6 F (37 C) (12/06 0709) Temp Source: Oral (12/06 0709) BP: 119/83 mmHg (12/06 1000) Pulse Rate: 100 (12/06 1000) Intake/Output from previous day: 12/05 0701 - 12/06 0700 In: 601.7 [I.V.:331.7; NG/GT:120; IV Piggyback:150] Out: 2450 [Urine:2450] Intake/Output from this shift: Total I/O In: 147.5 [P.O.:50; I.V.:60; IV Piggyback:37.5] Out: 350 [Urine:350]  Labs:  Recent Labs  01/17/14 0430 01/18/14 0500 01/19/14 0530  WBC 4.9 6.7 9.4  HGB 11.6* 11.5* 12.3*  PLT 181 243 309  CREATININE 1.53* 1.56* 1.64*   Estimated Creatinine Clearance: 67.2 mL/min (by C-G formula based on Cr of 1.64). No results for input(s): VANCOTROUGH, VANCOPEAK, VANCORANDOM, GENTTROUGH, GENTPEAK, GENTRANDOM, TOBRATROUGH, TOBRAPEAK, TOBRARND, AMIKACINPEAK, AMIKACINTROU, AMIKACIN in the last 72 hours.   Microbiology: Recent Results (from the past 720 hour(s))  Urine culture     Status: None   Collection Time: 01/12/14  3:01 PM  Result Value Ref Range Status   Specimen Description URINE, CLEAN CATCH  Final   Special Requests NONE  Final   Culture  Setup Time   Final    01/12/2014 21:38 Performed at [place]   Final    30,000 COLONIES/ML Performed at [facility]    Culture   Final    Multiple bacterial morphotypes present, none predominant. Suggest appropriate recollection if clinically indicated. Performed at [facility]    Report Status 01/14/2014 FINAL  Final  MRSA PCR Screening     Status: None   Collection Time: 01/13/14  6:16 AM  Result Value Ref Range Status   MRSA by PCR NEGATIVE NEGATIVE Final    Comment:        The GeneXpert MRSA Assay (FDA approved for  NASAL specimens only), is one component of a comprehensive MRSA colonization surveillance program. It is not intended to diagnose MRSA infection nor to guide or monitor treatment for MRSA infections.   Culture, expectorated sputum-assessment     Status: None   Collection Time: 01/13/14 10:00 AM  Result Value Ref Range Status   Specimen Description SPUTUM EXPECTORATED  Final   Special Requests NONE  Final   Sputum evaluation   Final    THIS SPECIMEN IS ACCEPTABLE. RESPIRATORY CULTURE REPORT TO FOLLOW. Performed at [facility]    Report Status 01/13/2014 FINAL  Final  Culture, respiratory (NON-Expectorated)     Status: None   Collection Time: 01/13/14 10:00 AM  Result Value Ref Range Status   Specimen Description SPUTUM EXPECTORATED  Final   Special Requests NONE  Final   Gram Stain   Final    ABUNDANT WBC PRESENT,BOTH PMN AND MONONUCLEAR NO SQUAMOUS EPITHELIAL CELLS SEEN FEW GRAM POSITIVE COCCI IN PAIRS IN CLUSTERS Performed at [facility]    Culture   Final    NORMAL OROPHARYNGEAL FLORA Performed at [facility]    Report Status 01/15/2014 FINAL  Final  Culture, blood (routine x 2)     Status: None   Collection Time: 01/13/14 10:03 AM  Result Value Ref Range Status   Specimen Description BLOOD RIGHT ARM  Final   Special Requests BOTTLES DRAWN AEROBIC AND ANAEROBIC 5CC  Final   Culture NO GROWTH 5 DAYS  Final  Report Status 01/18/2014 FINAL  Final  Culture, blood (routine x 2)     Status: None   Collection Time: 01/13/14 10:17 AM  Result Value Ref Range Status   Specimen Description BLOOD RIGHT HAND  Final   Special Requests BOTTLES DRAWN AEROBIC AND ANAEROBIC 5CC  Final   Culture NO GROWTH 5 DAYS  Final   Report Status 01/18/2014 FINAL  Final  Culture, Urine     Status: None   Collection Time: 01/15/14  1:21 PM  Result Value Ref Range Status   Specimen Description URINE, CATHETERIZED  Final   Special Requests NONE  Final   Culture   Setup Time   Final    01/15/2014 21:41 Performed at [place] Performed at [facility]   Final   Culture NO GROWTH Performed at [facility]   Final   Report Status 01/16/2014 FINAL  Final  Culture, respiratory (NON-Expectorated)     Status: None   Collection Time: 01/15/14  4:02 PM  Result Value Ref Range Status   Specimen Description ENDOTRACHEAL  Final   Special Requests NONE  Final   Gram Stain   Final    MODERATE WBC PRESENT,BOTH PMN AND MONONUCLEAR NO SQUAMOUS EPITHELIAL CELLS SEEN NO ORGANISMS SEEN Performed at [facility]    Culture   Final    RARE CANDIDA ALBICANS Performed at [facility]    Report Status 01/17/2014 FINAL  Final  Respiratory virus panel (routine influenza)     Status: None   Collection Time: 01/15/14  4:21 PM  Result Value Ref Range Status   Source - [facility] NASAL SWAB  Corrected    Comment: CORRECTED ON 12/04 AT 2154: PREVIOUSLY REPORTED AS NASAL SWAB   Respiratory Syncytial Virus A NOT DETECTED  Final   Respiratory Syncytial Virus B NOT DETECTED  Final   Influenza A NOT DETECTED  Final   Influenza B NOT DETECTED  Final   Parainfluenza 1 NOT DETECTED  Final   Parainfluenza 2 NOT DETECTED  Final   Parainfluenza 3 NOT DETECTED  Final   Metapneumovirus NOT DETECTED  Final   Rhinovirus NOT DETECTED  Final   Adenovirus NOT DETECTED  Final   Influenza A H1 NOT DETECTED  Final   Influenza A H3 NOT DETECTED  Final    Comment: (NOTE)       Normal Reference Range for each Analyte: NOT DETECTED Testing performed using the Luminex xTAG Respiratory Viral Panel test kit. The analytical performance characteristics of this assay have been determined by [facility].  The modifications have not been cleared or approved by the FDA. This assay has been validated pursuant to the CLIA regulations and is used for clinical purposes. Performed at [place]: 23 year old male originally admitted to [facility] on 01/13/14 for pneumonia with progressive worsening of respiratory status and transferred to [facility] on 12/2 for intubation.  SCr was 0.99 originally upon transfer, but has now trended up to 1.64- has been stable for the past 4 days, est CrCl ~65-68m/min.  CXR stable. WBC nml, afebrile. Patient was extubated yesterday.  Goal of Therapy:  Clinical resolution of infection.  Plan:  1. Continue Zosyn 3.375g IV q8h- each dose over 4 hours 2. Stop date entered for 10 total days of Zosyn (12/9) 3. Pharmacy to sign off as no further dose changes are anticipated. Please re-consult if  needed.   Channel Papandrea D. Katlynne Mckercher, PharmD, BCPS Clinical Pharmacist Pager: [phone] 01/19/2014 11:28 AM

## 2014-01-20 ENCOUNTER — Inpatient Hospital Stay (HOSPITAL_COMMUNITY): Payer: Medicaid - Out of State

## 2014-01-20 LAB — BASIC METABOLIC PANEL
Anion gap: 18 — ABNORMAL HIGH (ref 5–15)
BUN: 19 mg/dL (ref 6–23)
CALCIUM: 8.6 mg/dL (ref 8.4–10.5)
CO2: 23 mEq/L (ref 19–32)
CREATININE: 1.75 mg/dL — AB (ref 0.50–1.35)
Chloride: 97 mEq/L (ref 96–112)
GFR calc Af Amer: 61 mL/min — ABNORMAL LOW (ref >90)
GFR, EST NON AFRICAN AMERICAN: 53 mL/min — AB (ref >90)
GLUCOSE: 95 mg/dL (ref 70–99)
Potassium: 4.1 mEq/L (ref 3.7–5.3)
Sodium: 138 mEq/L (ref 137–147)

## 2014-01-20 LAB — CBC
HCT: 38 % — ABNORMAL LOW (ref 39.0–52.0)
HEMOGLOBIN: 12.7 g/dL — AB (ref 13.0–17.0)
MCH: 28.7 pg (ref 26.0–34.0)
MCHC: 33.4 g/dL (ref 30.0–36.0)
MCV: 85.8 fL (ref 78.0–100.0)
Platelets: 350 10*3/uL (ref 150–400)
RBC: 4.43 MIL/uL (ref 4.22–5.81)
RDW: 12.9 % (ref 11.5–15.5)
WBC: 8.4 10*3/uL (ref 4.0–10.5)

## 2014-01-20 LAB — MAGNESIUM: Magnesium: 2.5 mg/dL (ref 1.5–2.5)

## 2014-01-20 LAB — PHOSPHORUS: Phosphorus: 3.8 mg/dL (ref 2.3–4.6)

## 2014-01-20 MED ORDER — SODIUM CHLORIDE 0.9 % IV SOLN: INTRAVENOUS | Status: DC | PRN

## 2014-01-20 MED ORDER — AMOXICILLIN-POT CLAVULANATE 875-125 MG PO TABS
1.0000 | ORAL_TABLET | Freq: Two times a day (BID) | ORAL | Status: DC
  Administered 2014-01-20 – 2014-01-21 (×3): 1 via ORAL
  Filled 2014-01-20 (×4): qty 1

## 2014-01-20 MED ORDER — ENSURE COMPLETE PO LIQD
237.0000 mL | Freq: Two times a day (BID) | ORAL | Status: DC
  Administered 2014-01-20 – 2014-01-21 (×2): 237 mL via ORAL

## 2014-01-20 NOTE — Evaluation (Signed)
Physical Therapy Evaluation Patient Details Name: Omar Rodriguez C Shinault MRN: [id number] DOB: 06/29/1990 Today's Date: 01/20/2014   History of Present Illness  Pt is a 23 y/o otherwise well young male presented 11/29 to [facility] ED with 6 day hx fever, chills productive cough. Of note previously had presumed gastroenteritis with n/v/d a few days prior. Admitted with sepsis r/t CAP. Had progressive hypoxia, requiring bipap intermittently, worsening CXR. Treated initially with levaquin, transitioned to Vanc/Zosyn and [facility] with worsening respiratory failure.Pt was extubated on 01/18/14.  Clinical Impression  Pt admitted with the above. Pt currently with functional limitations due to the deficits listed below (see PT Problem List). At the time of PT eval pt was able to perform transfers and ambulation with steadying assist. Pt with mild staggering to the right side during gait training, and was able to tolerate 480' of ambulation. Pt reports mild SOB/fatigue after walking. Pt will benefit from skilled PT to increase their independence and safety with mobility to allow discharge to the venue listed below.       Follow Up Recommendations No PT follow up;Supervision for mobility/OOB    Equipment Recommendations  None recommended by PT    Recommendations for Other Services       Precautions / Restrictions Precautions Precautions: Fall Restrictions Weight Bearing Restrictions: No      Mobility  Bed Mobility Overal bed mobility: Modified Independent             General bed mobility comments: Pt was able to transition to EOB with HOB elevated and no physical assistance. Increased time required.   Transfers Overall transfer level: Needs assistance Equipment used: None Transfers: Sit to/from Stand Sit to Stand: Min assist         General transfer comment: Pt unsteady with transfers, requiring assist to steady and recover.   Ambulation/Gait Ambulation/Gait assistance: Min  assist Ambulation Distance (Feet): 480 Feet Assistive device: None Gait Pattern/deviations: Step-through pattern;Decreased stride length;Trunk flexed;Staggering right   Gait velocity interpretation: at or above normal speed for age/gender General Gait Details: Once ambulating, pt started slow and improved to a gait speed that is [facility]FL. Pt required assist to recover from unsteadiness. Pt cued for posture and general safety awareness.   Stairs            Wheelchair Mobility    Modified Rankin (Stroke Patients Only)       Balance Overall balance assessment: Needs assistance Sitting-balance support: Feet supported;No upper extremity supported Sitting balance-Leahy Scale: Good     Standing balance support: No upper extremity supported;During functional activity Standing balance-Leahy Scale: Poor Standing balance comment: Requires assist.                              Pertinent Vitals/Pain Pain Assessment: No/denies pain    Home Living Family/patient expects to be discharged to:: Private residence Living Arrangements: Parent Available Help at Discharge: Family;Available 24 hours/day Type of Home: House Home Access: Stairs to enter   [employer] Corporation[employer]trance Stairs-Number of Steps: 3 Home Layout: One level Home Equipment: None      Prior Function Level of Independence: Independent               Hand Dominance   Dominant Hand: Right    Extremity/Trunk Assessment   Upper Extremity Assessment: Overall WFL for tasks assessed           Lower Extremity Assessment: Overall WFL for tasks assessed (Grossly  4/5 in quads, 5/5 hamstrings, 4+/5 hip flexors)      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)  Communication   Communication: No difficulties  Cognition Arousal/Alertness: Awake/alert Behavior During Therapy: WFL for tasks assessed/performed Overall Cognitive Status: Within Functional Limits for tasks assessed                       General Comments      Exercises        Assessment/Plan    PT Assessment Patient needs continued PT services  PT Diagnosis Difficulty walking;Abnormality of gait   PT Problem List Decreased strength;Decreased range of motion;Decreased activity tolerance;Decreased balance;Decreased mobility;Decreased knowledge of use of DME;Decreased safety awareness;Decreased knowledge of precautions;Cardiopulmonary status limiting activity  PT Treatment Interventions DME instruction;Gait training;Stair training;Functional mobility training;Therapeutic activities;Therapeutic exercise;Neuromuscular re-education;Patient/family education   PT Goals (Current goals can be found in the Care Plan section) Acute Rehab PT Goals Patient Stated Goal: Return home with father PT Goal Formulation: With patient/family Time For Goal Achievement: 01/27/14 Potential to Achieve Goals: Good    Frequency Min 3X/week   Barriers to discharge        Co-evaluation               End of Session Equipment Utilized During Treatment: Gait belt Activity Tolerance: Patient tolerated treatment well Patient left: in chair;with call bell/phone within reach;with family/visitor present;with nursing/sitter in room Nurse Communication: Mobility status         Time: 0934-1000 PT Time Calculation (min) (ACUTE ONLY): 26 min   Charges:   PT Evaluation $Initial PT Evaluation Tier I: 1 Procedure PT Treatments $Gait Training: 8-22 mins $Therapeutic Activity: 8-22 mins   PT G Codes:          Conni SlipperKirkman, Jailyne Chieffo 01/20/2014, 10:20 AM  Conni SlipperLaura Tajay Muzzy, PT, DPT Acute Rehabilitation Services Pager: [phone]

## 2014-01-20 NOTE — Progress Notes (Signed)
NUTRITION FOLLOW UP  INTERVENTION: Provide Ensure Complete po BID, each supplement provides 350 kcal and 13 grams of protein.  Provide nourishment snack (sandwich, fruit). Ordered.  Encourage adequate PO intake.  NUTRITION DIAGNOSIS: Inadequate oral intake related to inability to eat as evidenced by NPO status, advanced to diet; ongoing  Goal: Intake to meet >90% of estimated nutrition needs;met  Monitor:  PO intake,, weight trend, labs, vent status.  23 y.o. male  Admitting Dx: Respiratory failure, CAP  ASSESSMENT: 23 yo male presented 11/29 to [facility] ED with sepsis r/t CAP. Previously had presumed gastroenteritis with n/v/d a few days prior. Transferred to [facility] with worsening respiratory failure on 12/2 and required intubation.   Pt extubated 12/5. Diet started 12/6. Meal completion is 25-50%. Pt reports his appetite is currently just "ok". He reports his throat is also sore, which is affecting his food consumption. Pt is agreeable to Ensure. Pt also reports wanting a sandwich and grapes as a nourishment snack. Will order. Pt was encouraged to eat his food at meals.   Labs: Low GFR. High creatinine.  Height: Ht Readings from Last 1 Encounters:  01/19/14 '5\' 8"'  (1.727 m)    Weight: Wt Readings from Last 1 Encounters:  01/19/14 160 lb 0.9 oz (72.6 kg)    BMI:  Body mass index is 24.34 kg/(m^2).  Re-Estimated Nutritional Needs: Kcal: 2100-2300 Protein: 95-110 gm Fluid: Per MD  Skin: WDL  Diet Order: Diet regular   Intake/Output Summary (Last 24 hours) at 01/20/14 0946 Last data filed at 01/20/14 0939  Gross per 24 hour  Intake   1405 ml  Output   1950 ml  Net   -545 ml    Last BM: PTA   Labs:   Recent Labs Lab 01/16/14 0425  01/18/14 0500 01/19/14 0530 01/20/14 0557  NA 136*  < > 140 141 138  K 4.4  < > 4.3 4.0 4.1  CL 98  < > 102 100 97  CO2 25  < > '28 26 23  ' BUN 17  < > '19 19 19  ' CREATININE 1.59*  < > 1.56* 1.64* 1.75*  CALCIUM  7.8*  < > 8.0* 8.4 8.6  MG 2.4  --   --   --  2.5  PHOS 3.3  --   --   --  3.8  GLUCOSE 119*  < > 98 82 95  < > = values in this interval not displayed.  CBG (last 3)   Recent Labs  01/19/14 0002 01/19/14 0349 01/19/14 1154  GLUCAP 86 87 97    Scheduled Meds: . enoxaparin (LOVENOX) injection  40 mg Subcutaneous Q24H  . metoCLOPramide (REGLAN) injection  5 mg Intravenous 4 times per day  . pantoprazole  40 mg Oral Daily  . piperacillin-tazobactam (ZOSYN)  IV  3.375 g Intravenous Q8H    Continuous Infusions: . sodium chloride 20 mL/hr at 01/19/14 0400    History reviewed. No pertinent past medical history.  History reviewed. No pertinent past surgical history.   Kallie Locks, MS, RD, LDN Pager # [phone] After hours/ weekend pager # [phone]

## 2014-01-20 NOTE — Progress Notes (Signed)
Patient ambulated approx 200 feet while maintaining O2 sats between 93% and 100%. No dyspnea observed.   Carrie MewJasmine Mae Cianci, RN

## 2014-01-20 NOTE — Plan of Care (Signed)
Problem: Phase III Progression Outcomes Goal: O2 sats > or equal to 93% on room air Outcome: Completed/Met Date Met:  01/20/14 Goal: Pain controlled on oral analgesia Outcome: Completed/Met Date Met:  01/20/14 Goal: Tolerating diet Outcome: Completed/Met Date Met:  01/20/14 Goal: Convert IV antibiotics to PO Outcome: Completed/Met Date Met:  01/20/14 Goal: Discharge plan remains appropriate-arrangements made Outcome: Completed/Met Date Met:  01/20/14

## 2014-01-20 NOTE — Progress Notes (Signed)
PULMONARY / CRITICAL CARE MEDICINE   Name: Jonna Coupony C Trembley MRN: [id number] DOB: 10/22/1990    ADMISSION DATE:  01/12/2014  REFERRING MD :  Ardyth HarpsHernandez   CHIEF COMPLAINT:  Respiratory failure, CAP   INITIAL PRESENTATION:  23 yo male presented to [facility] with fever, chills, productive cough, nausea with vomiting for 6 days from PNA with hypoxic respiratory failure.  [facility].  STUDIES:  11/30 CT chest >> L>R multifocal infiltrates, small bilat effusions   SIGNIFICANT EVENTS: 11/29 admit [place], CAP, sepsis  12/02 tx [place] with worsening hypoxic resp failure  12/03 vomiting tube feeds 12/04 gagging, vomiting with transient increase in FIO2/PEEP 12/06 transfer to flr bed 12/07 change to oral abx, d/c reglan  SUBJECTIVE:  Feels better.  Denies chest pain.  Had cough with sputum and faint blood streak.  VITAL SIGNS: Temp:  [98 F (36.7 C)-98.6 F (37 C)] 98.6 F (37 C) (12/06 2300) Pulse Rate:  [91-105] 91 (12/07 0940) Resp:  [18-31] 18 (12/06 2300) BP: (113-117)/(55-77) 113/74 mmHg (12/07 0940) SpO2:  [93 %-99 %] 93 % (12/07 0940) Weight:  [160 lb 0.9 oz (72.6 kg)] 160 lb 0.9 oz (72.6 kg) (12/06 1621) INTAKE / OUTPUT:  Intake/Output Summary (Last 24 hours) at 01/20/14 1153 Last data filed at 01/20/14 0939  Gross per 24 hour  Intake   1100 ml  Output   1600 ml  Net   -500 ml    PHYSICAL EXAMINATION: General: no distress, calm Neuro: normal strength HEENT: poor dentition Cardiovascular: regular Lungs: bronchial breath sounds LT lung Abdomen: soft, non tender Musculoskeletal: no edema Skin: no rashes   LABS:  CBC  Recent Labs Lab 01/18/14 0500 01/19/14 0530 01/20/14 0557  WBC 6.7 9.4 8.4  HGB 11.5* 12.3* 12.7*  HCT 35.9* 36.6* 38.0*  PLT 243 309 350   BMET  Recent Labs Lab 01/18/14 0500 01/19/14 0530 01/20/14 0557  NA 140 141 138  K 4.3 4.0 4.1  CL 102 100 97  CO2 28 26 23   BUN 19 19 19   CREATININE 1.56*  1.64* 1.75*  GLUCOSE 98 82 95   Electrolytes  Recent Labs Lab 01/16/14 0425  01/18/14 0500 01/19/14 0530 01/20/14 0557  CALCIUM 7.8*  < > 8.0* 8.4 8.6  MG 2.4  --   --   --  2.5  PHOS 3.3  --   --   --  3.8  < > = values in this interval not displayed.   Sepsis Markers  Recent Labs Lab 01/15/14 1201 01/15/14 1230 01/16/14 0425  LATICACIDVEN  --  1.0  --   PROCALCITON 0.93  --  1.00   ABG  Recent Labs Lab 01/15/14 1023 01/15/14 1456 01/16/14 0500  PHART 7.409 7.425 7.378  PCO2ART 44.5 43.4 45.9*  PO2ART 84.4 98.0 91.5   Glucose  Recent Labs Lab 01/18/14 0716 01/18/14 1536 01/18/14 1945 01/19/14 0002 01/19/14 0349 01/19/14 1154  GLUCAP 98 91 76 86 87 97    Imaging Dg Chest Port 1 View  01/19/2014   CLINICAL DATA:  Pneumonia  EXAM: PORTABLE CHEST - 1 VIEW  COMPARISON:  01/18/2014  FINDINGS: Right IJ central line tip terminates of cavoatrial junction. Mild cardiomegaly persists. Hazy left pulmonary opacity is unchanged. No focal right sided pulmonary parenchymal opacification. Small left pleural effusion.  IMPRESSION: No significant change in hazy left lung base opacity with left pleural effusion which may correlate with the provided history of pneumonia.   Electronically Signed  By: Gretchen  Green M.D.   On: 01/19/2014 08:12    ASSESSMENT / PLANChristiana Pellant:  PULMONARY ETT 12/2 >> 12/5 A: Acute hypoxic respiratory failure 2nd to PNA. P:   Assess for home oxygen needs F/u CXR as outpt in few weeks Bronchial hygiene  CARDIOVASCULAR Rt IJ CVL 12/02 >> A: Severe sepsis 2nd to PNA >> much improved. P:  Monitor hemodynamics Keep CVL in for now since he will likely be d/c on 12/08 >> will d/c CVL prior to sending home  RENAL A: Acute kidney injury 2nd to sepsis. P:   Optimize volume status Monitor renal fx, urine outpt, electrolytes  GASTROINTESTINAL A: Nutrition. Nausea with vomiting 12/03 >> resolved. P:   D/c reglan 12/07 Regular diet D/c  protonix 12/07 >> SUP no longer indicated  HEMATOLOGIC A: No acute issues. P:  F/u CBC intermittently Lovenox for DVT prevention  INFECTIOUS A: CAP P:   Day 8/10 of Abx >> change to augmentin 12/07  ENDOCRINE A: No acute issues. P:   Monitor glucose on BMET  NEUROLOGIC A: Hx of ADHD. P:   Monitor mental status  Updated pt's father at bedside.  SUMMARY: Much improved.  Will transition off IV abx, d/c reglan, assess for home oxygen, and f/u renal fx >> if all stable, then probable d/c home 12/08.  Coralyn HellingVineet Trishelle Devora, MD [facility] 01/20/2014, 11:58 AM Pager:  [phone] After 3pm call: [phone]

## 2014-01-21 LAB — BASIC METABOLIC PANEL
Anion gap: 15 (ref 5–15)
BUN: 20 mg/dL (ref 6–23)
CO2: 23 meq/L (ref 19–32)
Calcium: 8.6 mg/dL (ref 8.4–10.5)
Chloride: 98 mEq/L (ref 96–112)
Creatinine, Ser: 1.73 mg/dL — ABNORMAL HIGH (ref 0.50–1.35)
GFR calc Af Amer: 62 mL/min — ABNORMAL LOW (ref >90)
GFR calc non Af Amer: 54 mL/min — ABNORMAL LOW (ref >90)
GLUCOSE: 117 mg/dL — AB (ref 70–99)
POTASSIUM: 4.2 meq/L (ref 3.7–5.3)
Sodium: 136 mEq/L — ABNORMAL LOW (ref 137–147)

## 2014-01-21 MED ORDER — DOCUSATE SODIUM 100 MG PO CAPS: 100.0000 mg | ORAL_CAPSULE | Freq: Every day | ORAL | Status: DC | PRN

## 2014-01-21 MED ORDER — DSS 100 MG PO CAPS: 100.0000 mg | ORAL_CAPSULE | Freq: Every day | ORAL | Status: DC | PRN

## 2014-01-21 MED ORDER — SODIUM CHLORIDE 0.9 % IJ SOLN
10.0000 mL | INTRAMUSCULAR | Status: DC | PRN
  Administered 2014-01-21: 20 mL

## 2014-01-21 MED ORDER — AMOXICILLIN-POT CLAVULANATE 875-125 MG PO TABS: 1.0000 | ORAL_TABLET | Freq: Two times a day (BID) | ORAL | Status: DC

## 2014-01-21 NOTE — Progress Notes (Signed)
Pt provided with discharge materials including instruction on activity level, diet, follow up appointments, and medications. Pt and family member verbalized understanding of all information. VSS. Pt escorted out via wheelchair by NT.  Carrie MewJasmine Alize Borrayo, RN

## 2014-01-21 NOTE — Progress Notes (Signed)
PULMONARY / CRITICAL CARE MEDICINE   Name: Jonna Coupony C [place] MRN: [id number] DOB: 07/21/1990    ADMISSION DATE:  01/12/2014  REFERRING MD :  Ardyth HarpsHernandez   CHIEF COMPLAINT:  Respiratory failure, CAP   INITIAL PRESENTATION:  23 yo male presented to [facility] with fever, chills, productive cough, nausea with vomiting for 6 days from PNA with hypoxic respiratory failure.  [facility].  STUDIES:  11/30 CT chest >> L>R multifocal infiltrates, small bilat effusions   SIGNIFICANT EVENTS: 11/29 admit [place], CAP, sepsis  12/02 tx [place] with worsening hypoxic resp failure  12/03 vomiting tube feeds 12/04 gagging, vomiting with transient increase in FIO2/PEEP 12/06 transfer to flr bed 12/07 change to oral abx, d/c reglan 12/8 dc home  SUBJECTIVE:  Feels better.   VITAL SIGNS: Temp:  [97.3 F (36.3 C)-97.9 F (36.6 C)] 97.3 F (36.3 C) (12/08 1011) Pulse Rate:  [85-88] 85 (12/08 1011) Resp:  [18] 18 (12/08 1011) BP: (108-114)/(71-81) 108/71 mmHg (12/08 1011) SpO2:  [94 %-97 %] 95 % (12/08 1011) Weight:  [160 lb 9 oz (72.831 kg)] 160 lb 9 oz (72.831 kg) (12/07 2129) INTAKE / OUTPUT:  Intake/Output Summary (Last 24 hours) at 01/21/14 1012 Last data filed at 01/21/14 0417  Gross per 24 hour  Intake    240 ml  Output    950 ml  Net   -710 ml    PHYSICAL EXAMINATION: General: no distress, calm, ready to go home Neuro: normal strength HEENT: poor dentition Cardiovascular: regular Lungs: bronchial breath sounds LT lung Abdomen: soft, non tender, +bs Musculoskeletal: no edema Skin: no rashes   LABS:  CBC  Recent Labs Lab 01/18/14 0500 01/19/14 0530 01/20/14 0557  WBC 6.7 9.4 8.4  HGB 11.5* 12.3* 12.7*  HCT 35.9* 36.6* 38.0*  PLT 243 309 350   BMET  Recent Labs Lab 01/19/14 0530 01/20/14 0557 01/21/14 0530  NA 141 138 136*  K 4.0 4.1 4.2  CL 100 97 98  CO2 26 23 23   BUN 19 19 20   CREATININE 1.64* 1.75* 1.73*  GLUCOSE 82 95  117*   Electrolytes  Recent Labs Lab 01/16/14 0425  01/19/14 0530 01/20/14 0557 01/21/14 0530  CALCIUM 7.8*  < > 8.4 8.6 8.6  MG 2.4  --   --  2.5  --   PHOS 3.3  --   --  3.8  --   < > = values in this interval not displayed.   Sepsis Markers  Recent Labs Lab 01/15/14 1201 01/15/14 1230 01/16/14 0425  LATICACIDVEN  --  1.0  --   PROCALCITON 0.93  --  1.00   ABG  Recent Labs Lab 01/15/14 1023 01/15/14 1456 01/16/14 0500  PHART 7.409 7.425 7.378  PCO2ART 44.5 43.4 45.9*  PO2ART 84.4 98.0 91.5   Glucose  Recent Labs Lab 01/18/14 0716 01/18/14 1536 01/18/14 1945 01/19/14 0002 01/19/14 0349 01/19/14 1154  GLUCAP 98 91 76 86 87 97    Imaging Dg Chest Port 1 View  01/20/2014   CLINICAL DATA:  Respiratory failure.  EXAM: PORTABLE CHEST - 1 VIEW  COMPARISON:  01/19/2014.  FINDINGS: Right IJ line in stable position. Persistent left lung base infiltrate most consistent pneumonia. Associated left lower lobe atelectasis present. No pleural effusion or pneumothorax. Stable cardiomegaly with normal pulmonary vascularity. No acute bony abnormality.  IMPRESSION: 1. Right IJ line in stable position. 2. Persistent left lower lobe infiltrate with associated atelectasis.   Electronically Signed  ByMaisie Fus: Thomas  Register   On: 01/20/2014 07:29    ASSESSMENT / PLAN:  PULMONARY ETT 12/2 >> 12/5 A: Acute hypoxic respiratory failure 2nd to PNA >> resolved. P:   F/u CXR as outpt in few weeks Bronchial hygiene  CARDIOVASCULAR Rt IJ CVL 12/02 >> 12/08 A: Severe sepsis 2nd to PNA >> Resolved 12/8 P:  Monitor hemodynamics Keep CVL in for now since he will likely be d/c on 12/08 >> will d/c CVL prior to sending home  RENAL A: Acute kidney injury 2nd to sepsis (resolving) P:   Optimize volume status Monitor renal fx, urine outpt, electrolytes  GASTROINTESTINAL A: Nutrition. Nausea with vomiting 12/03 >> resolved. Constipation. P:   D/c'ed reglan 12/07 Regular  diet Colace for no bm 12/8  HEMATOLOGIC A: No acute issues. P:  F/u CBC intermittently Lovenox for DVT prevention  INFECTIOUS A: CAP P:   Day 9/10 of Abx >> changed to augmentin 12/07  ENDOCRINE A: No acute issues. P:   Monitor glucose on BMET  NEUROLOGIC A: Hx of ADHD. P:   Monitor mental status  Updated pt's father at bedside.  SUMMARY: Much improved.  Only complaint no bm for several days. Off O2. Ready for dc home with PCCM follow up 10-14 days, complete abx.  Brett CanalesSteve Minor ACNP [facility] Pager [phone] till 3 pm If no answer page [phone] 01/21/2014, 10:13 AM   Reviewed above, examined.  Feels better.  Denies chest/abd pain.  Tolerating diet.  Not needing oxygen.  Will d/c CVL and proceed with d/c home later today.  Coralyn HellingVineet Jared Whorley, MD [facility] 01/21/2014, 10:33 AM Pager:  [phone] After 3pm call: [phone]

## 2014-01-21 NOTE — Discharge Summary (Signed)
Physician Discharge Summary  Patient ID: Omar Rodriguez MRN: [id number] DOB/AGE: 24/10/1990 24 y.o.  Admit date: 01/12/2014 Discharge date: 01/21/2014  Problem List Active Problems:   Acute respiratory failure with hypoxia   Acute renal insufficiency   Dehydration   CAP (community acquired pneumonia)   UTI (lower urinary tract infection)   Sepsis   Acute respiratory failure  HPI: 23yo otherwise well young male presented 11/29 to [facility] ED with 6 day hx fever, chills productive cough. Of note previously had presumed gastroenteritis with n/v/d a few days prior. Admitted with sepsis r/t CAP. Had progressive hypoxia, requiring bipap intermittently, worsening CXR. Treated initially with levaquin, transitioned to Vanc/Zosyn and tx to [facility] with worsening respiratory failure.  Hospital Course:   STUDIES:  11/30 CT chest >> L>R multifocal infiltrates, small bilat effusions   SIGNIFICANT EVENTS: 11/29 admit [place], CAP, sepsis  12/02 tx [place] with worsening hypoxic resp failure  12/03 vomiting tube feeds 12/04 gagging, vomiting with transient increase in FIO2/PEEP 12/06 transfer to flr bed 12/07 change to oral abx, d/c reglan 12/8 dc home   ASSESSMENT / PLAN:  PULMONARY ETT 12/2 >> 12/5 A: Acute hypoxic respiratory failure 2nd to PNA. P:  Assess for home oxygen needs, not needed 93% on room air with ambulation. F/u CXR as outpt in few weeks Bronchial hygiene  CARDIOVASCULAR Rt IJ CVL 12/02 >> A: Severe sepsis 2nd to PNA >> .Resolved 12/8 P:  Monitor hemodynamics Keep CVL in for now since he will likely be d/c on 12/08 >> will d/c CVL prior to sending home  RENAL  Labs (Brief)    Lab Results  Component Value Date   CREATININE 1.73* 01/21/2014   CREATININE 1.75* 01/20/2014   CREATININE 1.64* 01/19/2014      A: Acute kidney injury 2nd to sepsis.(resolving) P:  Optimize volume status Monitor renal fx, urine outpt,  electrolytes  GASTROINTESTINAL A: Nutrition. Nausea with vomiting 12/03 >> resolved. P:  D/c reglan 12/07 Regular diet D/c protonix 12/07 >> SUP no longer indicated Colace for no bm 12/8  HEMATOLOGIC A: No acute issues. P:  F/u CBC intermittently Lovenox for DVT prevention  INFECTIOUS A: CAP P:  Day 9/10 of Abx >> change to augmentin 12/07  ENDOCRINE A: No acute issues. P:  Monitor glucose on BMET  NEUROLOGIC A: Hx of ADHD. P:  Monitor mental status  Updated pt's father at bedside.  SUMMARY: Much improved. Only complaint no bm for several days. Off O2. Ready for dc home with PCCM follow up 10-14 days, complete abx. Cxr and bmet have been arranged on follow up.  Labs at discharge Lab Results  Component Value Date   CREATININE 1.73* 01/21/2014   BUN 20 01/21/2014   NA 136* 01/21/2014   K 4.2 01/21/2014   CL 98 01/21/2014   CO2 23 01/21/2014   Lab Results  Component Value Date   WBC 8.4 01/20/2014   HGB 12.7* 01/20/2014   HCT 38.0* 01/20/2014   MCV 85.8 01/20/2014   PLT 350 01/20/2014   Lab Results  Component Value Date   ALT 39 01/13/2014   AST 52* 01/13/2014   ALKPHOS 65 01/13/2014   BILITOT 1.2 01/13/2014   No results found for: INR, PROTIME  Current radiology studies Dg Chest Port 1 View  01/20/2014   CLINICAL DATA:  Respiratory failure.  EXAM: PORTABLE CHEST - 1 VIEW  COMPARISON:  01/19/2014.  FINDINGS: Right IJ line in stable position. Persistent left lung base  infiltrate most consistent pneumonia. Associated left lower lobe atelectasis present. No pleural effusion or pneumothorax. Stable cardiomegaly with normal pulmonary vascularity. No acute bony abnormality.  IMPRESSION: 1. Right IJ line in stable position. 2. Persistent left lower lobe infiltrate with associated atelectasis.   Electronically Signed   By: Maisie Fushomas  Register   On: 01/20/2014 07:29    Disposition:  Final discharge disposition not confirmed     Medication List     TAKE these medications        amoxicillin-clavulanate 875-125 MG per tablet  Commonly known as:  AUGMENTIN  Take 1 tablet by mouth every 12 (twelve) hours.     DSS 100 MG Caps  Take 100 mg by mouth daily as needed for mild constipation (if no bowel movement).     ondansetron 4 MG tablet  Commonly known as:  ZOFRAN  Take 4 mg by mouth every 8 (eight) hours as needed for nausea or vomiting.           Follow-up Information    Follow up with PARRETT,TAMMY, NP On 02/04/2014.   Specialty:  Nurse Practitioner   Why:  2:00 pm with Tammy Parrett ANP-BC   Contact information:   [address] [place] [address] [phone]        Discharged Condition: fair  Time spent on discharge greater than 40 minutes.  Vital signs at Discharge. Temp:  [97.3 F (36.3 C)-97.9 F (36.6 C)] 97.3 F (36.3 C) (12/08 1011) Pulse Rate:  [85-88] 85 (12/08 1011) Resp:  [18] 18 (12/08 1011) BP: (108-114)/(71-81) 108/71 mmHg (12/08 1011) SpO2:  [94 %-97 %] 95 % (12/08 1011) Weight:  [160 lb 9 oz (72.831 kg)] 160 lb 9 oz (72.831 kg) (12/07 2129) Office follow up Special Information or instructions. Follow up with Tammy Parrett if further evaluation needed then follow up with available MD. He has completed abx. He has bmet and c x r arranged for follow up day. Signed: Brett CanalesSteve Minor ACNP [facility] Pager [phone] till 3 pm If no answer page [phone] 01/21/2014, 10:21 AM   Coralyn HellingVineet Khai Torbert, MD [facility] 01/21/2014, 10:35 AM Pager:  [phone] After 3pm call: [phone]

## 2014-02-04 ENCOUNTER — Ambulatory Visit (INDEPENDENT_AMBULATORY_CARE_PROVIDER_SITE_OTHER)
Admission: RE | Admit: 2014-02-04 | Discharge: 2014-02-04 | Disposition: A | Discharge status: Home / Self Care | Payer: Medicaid - Out of State | Source: Ambulatory Visit | Attending: Adult Health | Admitting: Adult Health

## 2014-02-04 ENCOUNTER — Ambulatory Visit (INDEPENDENT_AMBULATORY_CARE_PROVIDER_SITE_OTHER): Payer: Medicaid - Out of State | Admitting: Adult Health

## 2014-02-04 ENCOUNTER — Encounter: Payer: Self-pay | Admitting: Adult Health

## 2014-02-04 VITALS — BP 118/74 | HR 79 | Temp 98.0°F | Ht 67.0 in | Wt 161.8 lb

## 2014-02-04 DIAGNOSIS — J189 Pneumonia, unspecified organism: Secondary | ICD-10-CM

## 2014-02-04 NOTE — Patient Instructions (Signed)
Glad you are feeling better  Need establish with primary care doctor  Need to set up with Dentist for regular check ups  Follow up with our office As needed

## 2014-02-04 NOTE — Assessment & Plan Note (Signed)
Clinically improved and cxr w/ near clearance  No further abx are indicated.   Plan  Follow up with pulmonary As needed   Need establish with primary care doctor  Need to set up with Dentist for regular check ups  Follow up with our office As needed

## 2014-02-04 NOTE — Progress Notes (Signed)
   Subjective:    Patient ID: Omar Rodriguez, male    DOB: 11/28/1990, 23 y.o.   MRN: [id number]  HPI 23 yo male never smoker admitted by PCCM for CAP, Hypoxic Resp failure, dehydration, renal failure and sepsis 01/12/14   02/04/2014 Post Hospital follow up  Patient presents for a post hospital follow-up. Patient was admitted November 29 through December 8 for acute community-acquired pneumonia complicated by acute hypoxic respiratory failure, renal insufficiency, dehydration and sepsis. He did require initial BiPAP support. Was treated with aggressive IV antibiotics, CT chest showed left greater than right. Multifocal infiltrates. He did require volume resuscitation with acute renal failure and dehydration. Patient was discharged on Augmentin. Since discharge he is feeling improved with decreased cough, shortness of breath. Patient does still feel some weakness. He has finished his antibiotics. He denies any nausea, vomiting, diarrhea, chest pain, orthopnea, PND or leg swelling. Does have some residual mucus that is on and off with yellow and green mucus.  Chest x-ray today shows significant improvement in his pneumonia with near complete resolution of a left lower infiltrates. He is feeling better. Great appetite .     Review of Systems Constitutional:   No  weight loss, night sweats,  Fevers, chills, fatigue, or  lassitude.  HEENT:   No headaches,  Difficulty swallowing,  Tooth/dental problems, or  Sore throat,                No sneezing, itching, ear ache, nasal congestion, post nasal drip,   CV:  No chest pain,  Orthopnea, PND, swelling in lower extremities, anasarca, dizziness, palpitations, syncope.   GI  No heartburn, indigestion, abdominal pain, nausea, vomiting, diarrhea, change in bowel habits, loss of appetite, bloody stools.   Resp: No shortness of breath with exertion or at rest.  No excess mucus, no productive cough,  No non-productive cough,  No coughing up of blood.  No  change in color of mucus.  No wheezing.  No chest wall deformity  Skin: no rash or lesions.  GU: no dysuria, change in color of urine, no urgency or frequency.  No flank pain, no hematuria   MS:  No joint pain or swelling.  No decreased range of motion.  No back pain.  Psych:  No change in mood or affect. No depression or anxiety.  No memory loss.         Objective:   Physical Exam GEN: A/Ox3; pleasant , NAD, well nourished   HEENT:  Keith/AT,  EACs-clear, TMs-wnl, NOSE-clear, THROAT-clear, no lesions, no postnasal drip or exudate noted.   NECK:  Supple w/ fair ROM; no JVD; normal carotid impulses w/o bruits; no thyromegaly or nodules palpated; no lymphadenopathy.  RESP  Clear  P & A; w/o, wheezes/ rales/ or rhonchi.no accessory muscle use, no dullness to percussion  CARD:  RRR, no m/r/g  , no peripheral edema, pulses intact, no cyanosis or clubbing.  GI:   Soft & nt; nml bowel sounds; no organomegaly or masses detected.  Musco: Warm bil, no deformities or joint swelling noted.   Neuro: alert, no focal deficits noted.    Skin: Warm, no lesions or rashes  02/04/2014 CXR  Interim removal of right IJ line. Previously identified left lower lobe pneumonia has almost completely cleared with mild residual.Right lung is s clear. Heart size normal. No pleural effusion orpneumothorax.       Assessment & Plan:

## 2014-02-05 NOTE — Progress Notes (Signed)
Reviewed and agree with assessment/plan. 

## 2014-05-15 ENCOUNTER — Encounter (HOSPITAL_COMMUNITY): Payer: Self-pay | Admitting: Emergency Medicine

## 2014-05-15 ENCOUNTER — Emergency Department (HOSPITAL_COMMUNITY)
Admission: EM | Admit: 2014-05-15 | Discharge: 2014-05-15 | Disposition: A | Discharge status: Home / Self Care | Payer: Medicaid - Out of State | Attending: Emergency Medicine | Admitting: Emergency Medicine

## 2014-05-15 DIAGNOSIS — K088 Other specified disorders of teeth and supporting structures: Secondary | ICD-10-CM | POA: Diagnosis present

## 2014-05-15 DIAGNOSIS — K0889 Other specified disorders of teeth and supporting structures: Secondary | ICD-10-CM

## 2014-05-15 DIAGNOSIS — K029 Dental caries, unspecified: Secondary | ICD-10-CM | POA: Insufficient documentation

## 2014-05-15 DIAGNOSIS — Z8659 Personal history of other mental and behavioral disorders: Secondary | ICD-10-CM | POA: Insufficient documentation

## 2014-05-15 HISTORY — DX: Attention-deficit hyperactivity disorder, unspecified type: F90.9

## 2014-05-15 MED ORDER — NAPROXEN 500 MG PO TABS: 500.0000 mg | ORAL_TABLET | Freq: Two times a day (BID) | ORAL | Status: DC

## 2014-05-15 MED ORDER — PENICILLIN V POTASSIUM 500 MG PO TABS: 500.0000 mg | ORAL_TABLET | Freq: Four times a day (QID) | ORAL | Status: AC

## 2014-05-15 NOTE — ED Provider Notes (Signed)
CSN: [id number]     Arrival date & time 05/15/14  1330 History   First MD Initiated Contact with Patient 05/15/14 1338     Chief Complaint  Patient presents with  . Dental Pain     (Consider location/radiation/quality/duration/timing/severity/associated sxs/prior Treatment) HPI   Omar Rodriguez is a 24 y.o. male who presents to the Emergency Department complaining of dental pain for 3 days.  He reports pain to both the upper and lower left teeth causing difficulty chewing and sleeping.  He has not been taking any medications for his symptoms.  Patient;'s mother states that he tried to see a dentist this morning but could not get an appt until May.  He denies facial swelling, difficulty swallowing, fever, chills or neck pain.     Past Medical History  Diagnosis Date  . ADHD (attention deficit hyperactivity disorder)    Past Surgical History  Procedure Laterality Date  . 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 Diabetes Father   . Hypertension Father    History  Substance Use Topics  . Smoking status: Never Smoker   . Smokeless tobacco: Never Used  . Alcohol Use: No    Review of Systems  Constitutional: Negative for fever and appetite change.  HENT: Positive for dental problem. Negative for congestion, facial swelling, sore throat and trouble swallowing.   Eyes: Negative for pain and visual disturbance.  Musculoskeletal: Negative for neck pain and neck stiffness.  Skin: Negative for rash.  Neurological: Negative for dizziness, facial asymmetry and headaches.  Hematological: Negative for adenopathy.  All other systems reviewed and are negative.     Allergies  Ceclor  Home Medications   Prior to Admission medications   Not on File   BP 145/93 mmHg  Pulse 80  Temp(Src) 97.9 F (36.6 C) (Oral)  Resp 16  Ht  (1.727 m)  Wt 160 lb (72.576 kg)  BMI 24.33 kg/m2  SpO2 100% Physical Exam  Constitutional: He  is oriented to person, place, and time. He appears well-developed and well-nourished. No distress.  HENT:  Head: Normocephalic and atraumatic.  Right Ear: Tympanic membrane and ear canal normal.  Left Ear: Tympanic membrane and ear canal normal.  Mouth/Throat: Uvula is midline, oropharynx is clear and moist and mucous membranes are normal. No trismus in the jaw. Dental caries present. No dental abscesses or uvula swelling.  Diffuse dental caries with ttp of the left upper and lower first and second premolars, mild erythema of the surrounding gums. No facial swelling, obvious dental abscess, trismus, or sublingual abnml.    Neck: Normal range of motion. Neck supple.  Cardiovascular: Normal rate, regular rhythm and normal heart sounds.   No murmur heard. Pulmonary/Chest: Effort normal and breath sounds normal. No respiratory distress.  Musculoskeletal: Normal range of motion.  Lymphadenopathy:    He has no cervical adenopathy.  Neurological: He is alert and oriented to person, place, and time. He exhibits normal muscle tone. Coordination normal.  Skin: Skin is warm and dry.  Nursing note and vitals reviewed.   ED Course  Procedures (including critical care time) Labs Review Labs Reviewed - No data to display  Imaging Review No results found.   EKG Interpretation None      MDM   Final diagnoses:  Pain, dental    Pt is well appearing.  Vitals stable.  No concerning sx's for infection to the deep  structures of the neck or floor of the mouth.  Pt has appt with his dentist in May    Tammi Brion Hedges, [place]PA-C 05/15/14 1406  Zadie Rhineonald Wickline, MD 05/15/14 1535

## 2014-05-15 NOTE — Discharge Instructions (Signed)
Dental Pain °Toothache is pain in or around a tooth. It may get worse with chewing or with cold or heat.  °HOME CARE °· Your dentist may use a numbing medicine during treatment. If so, you may need to avoid eating until the medicine wears off. Ask your dentist about this. °· Only take medicine as told by your dentist or doctor. °· Avoid chewing food near the painful tooth until after all treatment is done. Ask your dentist about this. °GET HELP RIGHT AWAY IF:  °· The problem gets worse or new problems appear. °· You have a fever. °· There is redness and puffiness (swelling) of the face, jaw, or neck. °· You cannot open your mouth. °· There is pain in the jaw. °· There is very bad pain that is not helped by medicine. °MAKE SURE YOU:  °· Understand these instructions. °· Will watch your condition. °· Will get help right away if you are not doing well or get worse. °Document Released: 07/20/2007 Document Revised: 04/25/2011 Document Reviewed: 07/20/2007 °ExitCare® Patient Information ©2015 [employer]. This information is not intended to replace advice given to you by your health care provider. Make sure you discuss any questions you have with your health care provider. ° ° ° °Emergency Department Resource Guide °1) Find a Doctor and Pay Out of Pocket °Although you won't have to find out who is covered by your insurance plan, it is a good idea to ask around and get recommendations. You will then need to call the office and see if the doctor you have chosen will accept you as a new patient and what types of options they offer for patients who are self-pay. Some doctors offer discounts or will set up payment plans for their patients who do not have insurance, but you will need to ask so you aren't surprised when you get to your appointment. ° °2) Contact Your Local Health Department °Not all health departments have doctors that can see patients for sick visits, but many do, so it is worth a call to see if yours does.  If you don't know where your local health department is, you can check in your phone book. The CDC also has a tool to help you locate your state's health department, and many state websites also have listings of all of their local health departments. ° °3) Find a Walk-in Clinic °If your illness is not likely to be very severe or complicated, you may want to try a walk in clinic. These are popping up all over the country in pharmacies, drugstores, and shopping centers. They're usually staffed by nurse practitioners or physician assistants that have been trained to treat common illnesses and complaints. They're usually fairly quick and inexpensive. However, if you have serious medical issues or chronic medical problems, these are probably not your best option. ° °No Primary Care Doctor: °- Call [facility] at  832-8000 - they can help you locate a primary care doctor that  accepts your insurance, provides certain services, etc. °- Physician Referral Service- 1-800-533-3463 ° °Chronic Pain Problems: °Organization         Address  Phone   Notes  °[facility]  (336) 297-2271 Patients need to be referred by their primary care doctor.  ° °Medication Assistance: °Organization         Address  Phone   Notes  °[place] [facility] [address] °[place], [place] [address] (336) 641-8030 --Must be a resident   of [place] °-- Must have NO insurance coverage whatsoever (no Medicaid/ Medicare, etc.) °-- The pt. MUST have a primary care doctor that directs their care regularly and follows them in the community °  °[facility]  (866) 331-1348   °[employer]  (888) 892-1162   ° °Agencies that provide inexpensive medical care: °Organization         Address  Phone   Notes  °[facility]  (336) 832-8035   °[facility]    (336) 832-7272   °[facility] [address] °[place], [place] [address] (336) 832-4777   °[facility] [address], °[place] (336) 271-4999   °[facility]    (336) 373-0678   °[facility]    (336) 272-1050   °[facility] ° [address], [place] Phone:  (336) 832-4444, Fax:  (336) 832-4440 Hours of Operation:  9 am - 6 pm, M-F.  Also accepts Medicaid/Medicare and self-pay.  °[facility] ° [address], [place] Phone: (336) 832-3150, Fax: (336) 832-3151. Hours of Operation:  8:30 am - 5:30 pm, M-F.  Also accepts Medicaid and self-pay.  °[facility] [place] [address], [place] Phone: (336) 878-6027   °[facility] [address], [place], [place] (336)723-1848, Ext. 123 Mondays & Thursdays: 7-9 AM.  First 15 patients are seen on a first come, first serve basis. °  ° °Medicaid-accepting [place] Providers: ° °Organization         Address  Phone   Notes  °[facility] [address], [place] (336) 641-2100 Also accepts self-pay patients.  °[facility] [address], [place] ° (336) 856-9996   °[facility] [address], [place] (336) 288-8857   °[facility] [address] [place] [address], [place] (336) 299-7000   °Veita Bland [address], [place]  ° (336) 373-1557 Only accepts [place] Access Medicaid patients after they have their name applied to their card.  ° °Self-Pay (no insurance) in [place]: ° °Organization         Address  Phone   Notes  °Sickle Cell Patients, [facility] [address], [address] (336) 832-1970   °[facility] [address], [place] (336) 832-4400   °[facility] [place] ° [address], [place] (336) 992-4800   °[facility]/Dr. Osei-Bonsu ° [address] [place] [address], [place] or [address], [place] (336) 841-8500  Phone number for both [place] and [place] locations is the same.  °[facility] [address], [place] (336) 299-0000   °[facility] [address] [place] [address], [place] or [address] (336) 852-7530 °(336) 878-2260   °[facility] [address], [place] (336) 350-1642, phone; (336) 294-5005, fax Sees patients 1st and 3rd Saturday of every month.  Must not qualify for public or private insurance (i.e. Medicaid, Medicare, [place] Health Choice, Veterans' Benefits) • Household income should be no more than 200% of the poverty level •The clinic cannot treat you if you are pregnant or think you are pregnant • Sexually transmitted diseases are not treated at the clinic.  ° ° °Dental Care: °Organization         Address    Phone  Notes  °[facility] [address], [place] (336) 641-6152 Accepts children up to age 21 who are enrolled in Medicaid or [place] Health Choice; pregnant women with a Medicaid card; and children who have applied for Medicaid or [place] Health Choice, but were declined, whose parents can pay a reduced fee at time of service.  °[facility] [place]  [address], [place] (336) 641-7733 Accepts children up to age 21 who are enrolled in Medicaid or [place] Health Choice; pregnant women with a Medicaid card; and children who have applied for Medicaid or [place] Health Choice, but were declined, whose parents can pay a reduced fee at time of service.  °[facility] ° [address], [place] (336) 641-4533 Patients are seen by appointment only. Walk-ins are not accepted. [facility] will see patients 18 years of age and older. °Monday - Tuesday (8am-5pm) °Most Wednesdays (8:30-5pm) °$30 per visit, cash only  °[facility] ° [address], [place] (336) 641-4533 Patients are seen by appointment  only. Walk-ins are not accepted. [facility] will see patients 18 years of age and older. °One Wednesday Evening (Monthly: Volunteer Based).  $30 per visit, cash only  °[facility]  (919) 537-3737 for adults; Children under age 4, call Graduate Pediatric Dentistry at (919) [phone]. Children aged 4-14, please call (919) [phone] to request a pediatric application. ° Dental services are provided in all areas of dental care including fillings, crowns and bridges, complete and partial dentures, implants, gum treatment, root canals, and extractions. Preventive care is also provided. Treatment is provided to both adults and children. °Patients are selected via a lottery and there is often a waiting list. °  °[facility] [address], °[place] ° (336) 763-8833 [email or web address].drcivils.[email or web address] °  °[facility] [address], [place], [place] (336)723-1848, Ext. 123 Second and Fourth Thursday of each month, opens at 6:30 AM; Clinic ends at 9 AM.  Patients are seen on a first-come first-served basis, and a limited number are seen during each clinic.  ° °[facility] ° [address], [place], [employer] (336) 723-7904   Eligibility Requirements °You must have lived in [place], [place], or [place] counties for at least the last three months. °  You cannot be eligible for state or federal sponsored healthcare insurance, including Veterans Administration, Medicaid, or Medicare. °  You generally cannot be eligible for healthcare insurance through your employer.  °  How to apply: °Eligibility screenings are held every Tuesday and Wednesday afternoon from 1:00 pm until 4:00 pm. You do not need an appointment for the interview!  °[facility] [address], [place], [place] 336-631-2330   °[facility]  336-342-8273   °[facility]  336-703-3100   °[facility]  336-570-6415   ° °Behavioral Health  Resources in the Community: °Intensive Outpatient Programs °Organization         Address  Phone  Notes  °[facility] [address], [place], [place] 336-878-6098   °[facility] [address], [place], [place] 336-832-9800   °[facility] [address], [place], [place] ° 336-882-2125   °[facility] [address],  °[place], [place] 1-800-853-5163 or 336-641-4981   °Substance Abuse Resources °Organization           Address  Phone  Notes  °[facility]  336-882-2125   °[facility]  336-784-9470   °[facility]  336-285-9073   °[facility]  336-845-3988   °Residential & Outpatient Substance Abuse Program  1-800-659-3381   °Psychological Services °Organization         Address  Phone  Notes  °[facility]  336- 832-9600   °[facility]  336- 378-7881   °[facility] [address], [place] 1-800-853-5163 or 336-641-4981   ° °Mobile Crisis Teams °Organization         Address  Phone  Notes  °[facility], Mobile Crisis Care Unit  1-877-626-1772   °Assertive °[facility] ° [address] [place], [place] 336-834-9664   °Sharon DeEsch [address] °[place] 336-554-5454   ° °Self-Help/Support Groups °Organization         Address  Phone             Notes  °[facility] [place] - variety of support groups  336- 373-1402 Call for more information  °Narcotics Anonymous (NA), [facility] [address], °[place]  2 meetings at this location  ° °Residential Treatment Programs °Organization         Address  Phone  Notes  °[facility] [address],    °[place]  1-866-801-8205   °[facility] ° [address], [place], [place] 704-293-8524   °[facility] [address], [place] 336-845-3988 Admissions: 8am-3pm M-F  °[facility] [address]    °[place], [place] 336-841-1104   °[facility] [address], [place], [place] 336-379-7146   °[facility] [address]  °[place], [place] 336-285-9073   °[facility] Intensive Outpatient [address], [place], [place] 336-852-3033   °[facility] [address]  °[place], [place] 1-877-615-2722 or 336-784-9470   °[facility] [address] [place], [place] 336-227-7417 Accepts Medicaid  °[facility] [address]  °[place] 1-800-659-3381 Substance Abuse/Addiction Treatment  ° °[place] Behavioral Health Resources °Organization         Address  Phone  Notes  °[facility]  (888) 581-9988   °Julie Brannon, PhD [address] [place], [place]   (336) 349-5553 or (336) 951-0000   °[facility]   [address] °[place], [place] (336) 349-4454   °[facility] [address], [place], [place] (336) 342-8316 Insurance/Medicaid/sponsorship through [facility]  °[facility] [address] [place]                                    [place], [place] (336) 342-8316 Therapy/tele-psych/case  °[facility] [address]  ° [place],  (336) 349-2233    °Dr. Arfeen  (336) 349-4544   °[facility] [place]  [employer] [place] [facility] 1) [address], [place] °2) [address], [place] °3)  [address], [place] (336) 349-3220 °(336) 342-7768 ° °(336) 342-8140   °[place] [facility] (336) 342-1394 or (336) 342-3537 (After Hours)    ° °  °

## 2014-05-15 NOTE — ED Notes (Signed)
Pt reports L upper and lower dental pain x 3 days.

## 2016-07-30 IMAGING — CR DG ABD PORTABLE 1V
1 series · 1 of 1 positions shown · non-contrast
Comparison: 01/15/2014

CLINICAL DATA: Vomiting.  Evaluate OG tube positioning.

EXAM:
PORTABLE ABDOMEN - 1 VIEW

[AP]
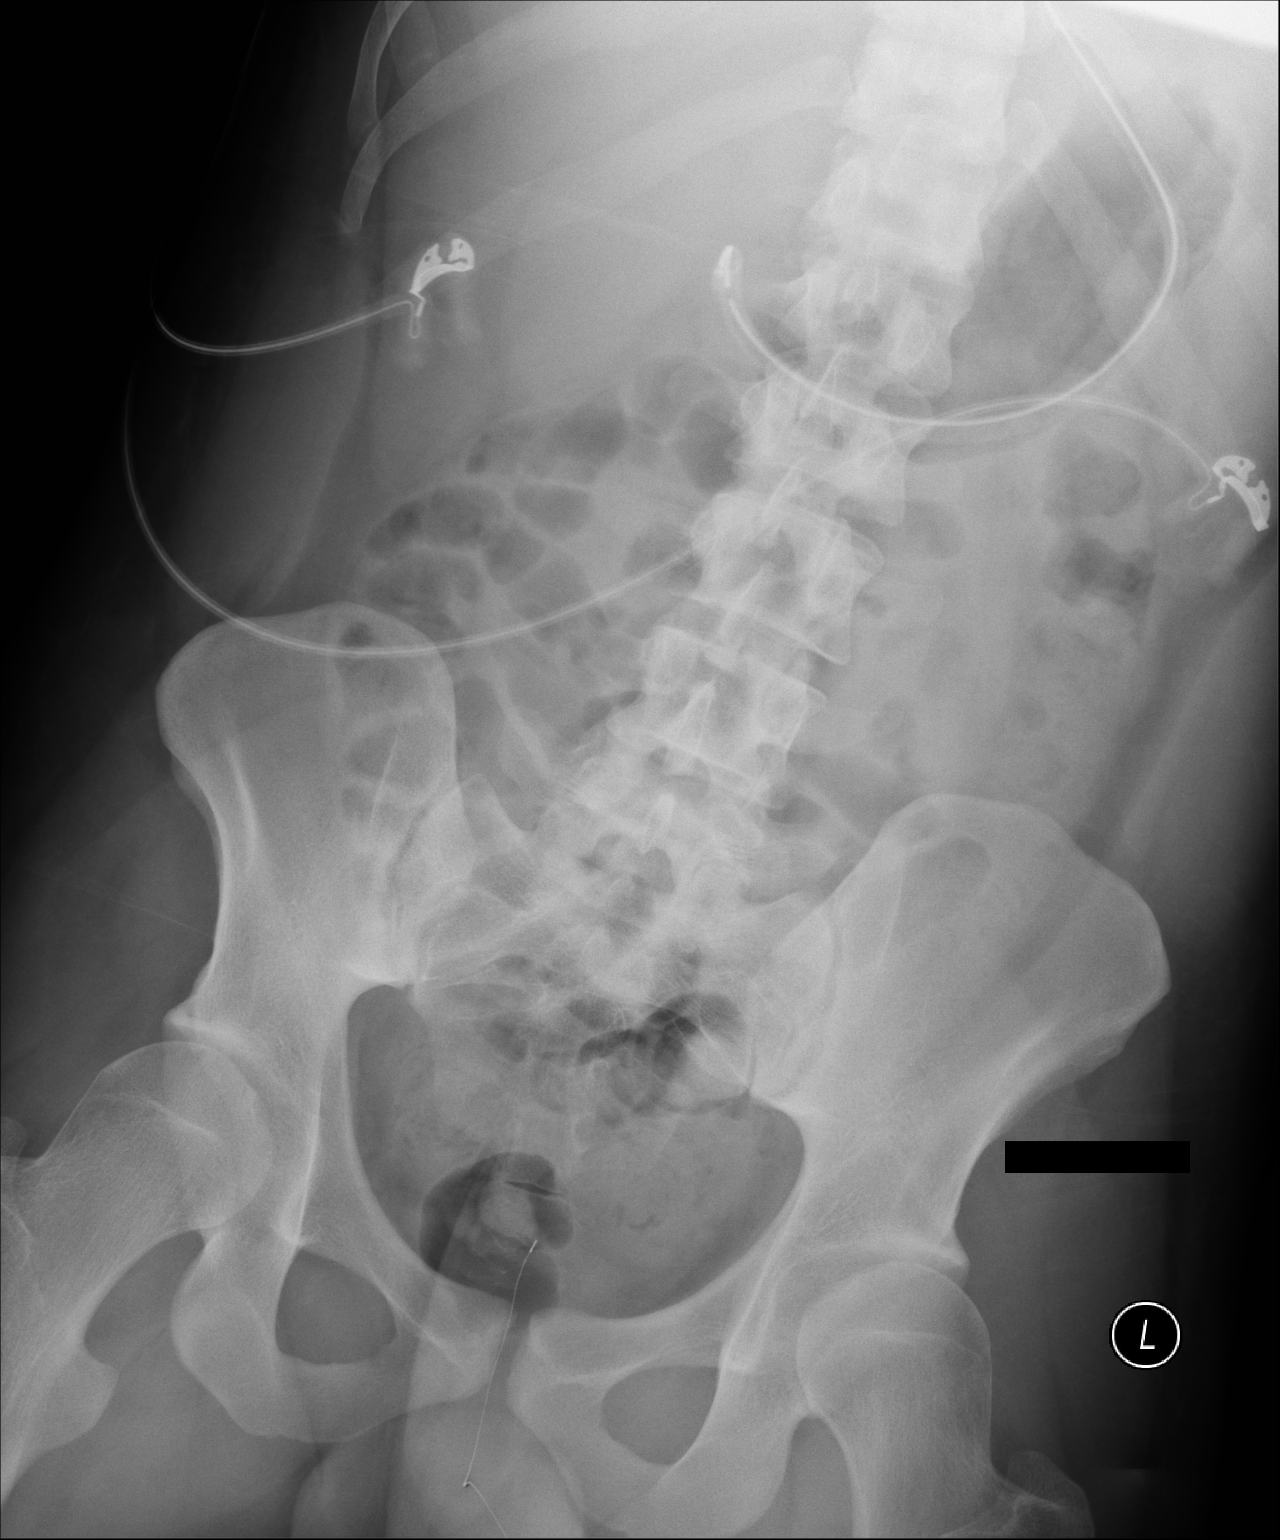

[1 of 1 positions shown; findings below may reference images not displayed]

FINDINGS: Orogastric tube tip in the distal stomach region. Gas in the small
bowel, large bowel and stomach. No large abdominal calcifications.
IMPRESSION: Orogastric tube in the distal stomach region.

Nonspecific bowel gas pattern.

## 2018-05-04 ENCOUNTER — Emergency Department (HOSPITAL_COMMUNITY): Payer: Medicare Other

## 2018-05-04 ENCOUNTER — Encounter (HOSPITAL_COMMUNITY): Payer: Self-pay | Admitting: Emergency Medicine

## 2018-05-04 ENCOUNTER — Other Ambulatory Visit: Payer: Self-pay

## 2018-05-04 ENCOUNTER — Observation Stay (HOSPITAL_COMMUNITY): Payer: Medicare Other

## 2018-05-04 ENCOUNTER — Inpatient Hospital Stay (HOSPITAL_COMMUNITY)
Admission: EM | Admit: 2018-05-04 | Discharge: 2018-05-11 | DRG: 442 | Disposition: A | Discharge status: Home / Self Care | Payer: Medicare Other | Attending: Internal Medicine | Admitting: Internal Medicine

## 2018-05-04 DIAGNOSIS — B1710 Acute hepatitis C without hepatic coma: Secondary | ICD-10-CM | POA: Diagnosis not present

## 2018-05-04 DIAGNOSIS — F909 Attention-deficit hyperactivity disorder, unspecified type: Secondary | ICD-10-CM | POA: Diagnosis present

## 2018-05-04 DIAGNOSIS — Z881 Allergy status to other antibiotic agents status: Secondary | ICD-10-CM

## 2018-05-04 DIAGNOSIS — B2700 Gammaherpesviral mononucleosis without complication: Secondary | ICD-10-CM | POA: Diagnosis present

## 2018-05-04 DIAGNOSIS — R161 Splenomegaly, not elsewhere classified: Secondary | ICD-10-CM | POA: Diagnosis present

## 2018-05-04 DIAGNOSIS — K759 Inflammatory liver disease, unspecified: Secondary | ICD-10-CM | POA: Diagnosis present

## 2018-05-04 DIAGNOSIS — R748 Abnormal levels of other serum enzymes: Secondary | ICD-10-CM | POA: Diagnosis present

## 2018-05-04 DIAGNOSIS — Z79899 Other long term (current) drug therapy: Secondary | ICD-10-CM

## 2018-05-04 DIAGNOSIS — K8019 Calculus of gallbladder with other cholecystitis with obstruction: Secondary | ICD-10-CM

## 2018-05-04 DIAGNOSIS — D696 Thrombocytopenia, unspecified: Secondary | ICD-10-CM | POA: Diagnosis present

## 2018-05-04 DIAGNOSIS — K8020 Calculus of gallbladder without cholecystitis without obstruction: Secondary | ICD-10-CM | POA: Diagnosis present

## 2018-05-04 DIAGNOSIS — R112 Nausea with vomiting, unspecified: Secondary | ICD-10-CM

## 2018-05-04 DIAGNOSIS — E860 Dehydration: Secondary | ICD-10-CM | POA: Diagnosis present

## 2018-05-04 DIAGNOSIS — D649 Anemia, unspecified: Secondary | ICD-10-CM | POA: Diagnosis present

## 2018-05-04 DIAGNOSIS — Z7952 Long term (current) use of systemic steroids: Secondary | ICD-10-CM

## 2018-05-04 DIAGNOSIS — K819 Cholecystitis, unspecified: Secondary | ICD-10-CM

## 2018-05-04 DIAGNOSIS — E871 Hypo-osmolality and hyponatremia: Secondary | ICD-10-CM | POA: Diagnosis present

## 2018-05-04 DIAGNOSIS — K8010 Calculus of gallbladder with chronic cholecystitis without obstruction: Secondary | ICD-10-CM | POA: Insufficient documentation

## 2018-05-04 DIAGNOSIS — D6959 Other secondary thrombocytopenia: Secondary | ICD-10-CM | POA: Diagnosis present

## 2018-05-04 DIAGNOSIS — R1011 Right upper quadrant pain: Secondary | ICD-10-CM | POA: Diagnosis not present

## 2018-05-04 DIAGNOSIS — R21 Rash and other nonspecific skin eruption: Secondary | ICD-10-CM | POA: Diagnosis present

## 2018-05-04 DIAGNOSIS — Z8701 Personal history of pneumonia (recurrent): Secondary | ICD-10-CM

## 2018-05-04 DIAGNOSIS — N179 Acute kidney failure, unspecified: Secondary | ICD-10-CM | POA: Diagnosis present

## 2018-05-04 HISTORY — DX: Pneumonia, unspecified organism: J18.9

## 2018-05-04 HISTORY — DX: Disease of gallbladder, unspecified: K82.9

## 2018-05-04 HISTORY — DX: Gammaherpesviral mononucleosis without complication: B27.00

## 2018-05-04 HISTORY — DX: Cholecystitis, unspecified: K81.9

## 2018-05-04 LAB — RAPID URINE DRUG SCREEN, HOSP PERFORMED
Amphetamines: NOT DETECTED
Barbiturates: NOT DETECTED
Benzodiazepines: NOT DETECTED
Cocaine: NOT DETECTED
OPIATES: POSITIVE — AB
Tetrahydrocannabinol: NOT DETECTED

## 2018-05-04 LAB — CBC
HEMATOCRIT: 42 % (ref 39.0–52.0)
HEMOGLOBIN: 13.8 g/dL (ref 13.0–17.0)
MCH: 28.8 pg (ref 26.0–34.0)
MCHC: 32.9 g/dL (ref 30.0–36.0)
MCV: 87.5 fL (ref 80.0–100.0)
NRBC: 0 % (ref 0.0–0.2)
Platelets: 135 10*3/uL — ABNORMAL LOW (ref 150–400)
RBC: 4.8 MIL/uL (ref 4.22–5.81)
RDW: 13.6 % (ref 11.5–15.5)
WBC: 10.2 10*3/uL (ref 4.0–10.5)

## 2018-05-04 LAB — URINALYSIS, ROUTINE W REFLEX MICROSCOPIC
Glucose, UA: NEGATIVE mg/dL
Ketones, ur: NEGATIVE mg/dL
LEUKOCYTE UA: NEGATIVE
Nitrite: NEGATIVE
PROTEIN: 100 mg/dL — AB
Specific Gravity, Urine: 1.031 — ABNORMAL HIGH (ref 1.005–1.030)
pH: 5 (ref 5.0–8.0)

## 2018-05-04 LAB — COMPREHENSIVE METABOLIC PANEL
ALT: 339 U/L — ABNORMAL HIGH (ref 0–44)
AST: 165 U/L — ABNORMAL HIGH (ref 15–41)
Albumin: 3.1 g/dL — ABNORMAL LOW (ref 3.5–5.0)
Alkaline Phosphatase: 232 U/L — ABNORMAL HIGH (ref 38–126)
Anion gap: 10 (ref 5–15)
BUN: 7 mg/dL (ref 6–20)
CHLORIDE: 98 mmol/L (ref 98–111)
CO2: 25 mmol/L (ref 22–32)
Calcium: 6.8 mg/dL — ABNORMAL LOW (ref 8.9–10.3)
Creatinine, Ser: 1.36 mg/dL — ABNORMAL HIGH (ref 0.61–1.24)
GFR calc Af Amer: >60 mL/min (ref >60)
Glucose, Bld: 94 mg/dL (ref 70–99)
Potassium: 3.7 mmol/L (ref 3.5–5.1)
Sodium: 133 mmol/L — ABNORMAL LOW (ref 135–145)
Total Bilirubin: 7.8 mg/dL — ABNORMAL HIGH (ref 0.3–1.2)
Total Protein: 6.5 g/dL (ref 6.5–8.1)

## 2018-05-04 LAB — LIPASE, BLOOD: Lipase: 50 U/L (ref 11–51)

## 2018-05-04 LAB — SURGICAL PCR SCREEN
MRSA, PCR: NEGATIVE
Staphylococcus aureus: NEGATIVE

## 2018-05-04 MED ORDER — SODIUM CHLORIDE 0.9% FLUSH: 3.0000 mL | Freq: Once | INTRAVENOUS | Status: DC

## 2018-05-04 MED ORDER — DIPHENHYDRAMINE HCL 50 MG/ML IJ SOLN
12.5000 mg | Freq: Four times a day (QID) | INTRAMUSCULAR | Status: DC | PRN
  Filled 2018-05-04: qty 1

## 2018-05-04 MED ORDER — SODIUM CHLORIDE 0.9 % IV BOLUS: 1000.0000 mL | Freq: Once | INTRAVENOUS | Status: DC

## 2018-05-04 MED ORDER — OXYCODONE HCL 5 MG PO TABS
5.0000 mg | ORAL_TABLET | ORAL | Status: DC | PRN
  Administered 2018-05-04 – 2018-05-09 (×6): 10 mg via ORAL
  Filled 2018-05-04 (×7): qty 2

## 2018-05-04 MED ORDER — ACETAMINOPHEN 650 MG RE SUPP: 650.0000 mg | Freq: Four times a day (QID) | RECTAL | Status: DC | PRN

## 2018-05-04 MED ORDER — MORPHINE SULFATE (PF) 2 MG/ML IV SOLN
1.0000 mg | INTRAVENOUS | Status: DC | PRN
  Administered 2018-05-04 – 2018-05-08 (×10): 2 mg via INTRAVENOUS
  Filled 2018-05-04 (×11): qty 1

## 2018-05-04 MED ORDER — CIPROFLOXACIN IN D5W 400 MG/200ML IV SOLN
400.0000 mg | Freq: Once | INTRAVENOUS | Status: AC
  Administered 2018-05-04: 400 mg via INTRAVENOUS
  Filled 2018-05-04: qty 200

## 2018-05-04 MED ORDER — METRONIDAZOLE IN NACL 5-0.79 MG/ML-% IV SOLN
500.0000 mg | Freq: Once | INTRAVENOUS | Status: AC
  Administered 2018-05-04: 500 mg via INTRAVENOUS
  Filled 2018-05-04: qty 100

## 2018-05-04 MED ORDER — ACETAMINOPHEN 325 MG PO TABS
650.0000 mg | ORAL_TABLET | Freq: Four times a day (QID) | ORAL | Status: DC | PRN
  Filled 2018-05-04: qty 2

## 2018-05-04 MED ORDER — CIPROFLOXACIN IN D5W 400 MG/200ML IV SOLN
400.0000 mg | Freq: Two times a day (BID) | INTRAVENOUS | Status: DC
  Administered 2018-05-04 – 2018-05-06 (×6): 400 mg via INTRAVENOUS
  Filled 2018-05-04 (×7): qty 200

## 2018-05-04 MED ORDER — ONDANSETRON HCL 4 MG/2ML IJ SOLN
4.0000 mg | Freq: Four times a day (QID) | INTRAMUSCULAR | Status: DC | PRN
  Administered 2018-05-05 – 2018-05-07 (×3): 4 mg via INTRAVENOUS
  Filled 2018-05-04 (×3): qty 2

## 2018-05-04 MED ORDER — PANTOPRAZOLE SODIUM 40 MG IV SOLR
40.0000 mg | Freq: Every day | INTRAVENOUS | Status: DC
  Administered 2018-05-04 – 2018-05-08 (×5): 40 mg via INTRAVENOUS
  Filled 2018-05-04 (×5): qty 40

## 2018-05-04 MED ORDER — GADOBUTROL 1 MMOL/ML IV SOLN
8.0000 mL | Freq: Once | INTRAVENOUS | Status: AC | PRN
  Administered 2018-05-04: 8 mL via INTRAVENOUS

## 2018-05-04 MED ORDER — SODIUM CHLORIDE 0.9 % IV BOLUS
1000.0000 mL | Freq: Once | INTRAVENOUS | Status: AC
  Administered 2018-05-04: 1000 mL via INTRAVENOUS

## 2018-05-04 MED ORDER — METRONIDAZOLE IN NACL 5-0.79 MG/ML-% IV SOLN
500.0000 mg | Freq: Three times a day (TID) | INTRAVENOUS | Status: DC
  Administered 2018-05-04 – 2018-05-07 (×9): 500 mg via INTRAVENOUS
  Filled 2018-05-04 (×10): qty 100

## 2018-05-04 MED ORDER — DIPHENHYDRAMINE HCL 12.5 MG/5ML PO ELIX
12.5000 mg | ORAL_SOLUTION | Freq: Four times a day (QID) | ORAL | Status: DC | PRN
  Administered 2018-05-07 – 2018-05-08 (×2): 12.5 mg via ORAL
  Filled 2018-05-04: qty 10

## 2018-05-04 MED ORDER — KCL IN DEXTROSE-NACL 20-5-0.45 MEQ/L-%-% IV SOLN
INTRAVENOUS | Status: DC
  Administered 2018-05-04 – 2018-05-09 (×12): via INTRAVENOUS
  Filled 2018-05-04 (×3): qty 1000
  Filled 2018-05-04: qty 2000
  Filled 2018-05-04 (×11): qty 1000

## 2018-05-04 MED ORDER — ONDANSETRON 4 MG PO TBDP: 4.0000 mg | ORAL_TABLET | Freq: Four times a day (QID) | ORAL | Status: DC | PRN

## 2018-05-04 MED ORDER — FENTANYL CITRATE (PF) 100 MCG/2ML IJ SOLN
50.0000 ug | Freq: Once | INTRAMUSCULAR | Status: AC
  Administered 2018-05-04: 50 ug via INTRAVENOUS
  Filled 2018-05-04: qty 2

## 2018-05-04 MED ORDER — ONDANSETRON HCL 4 MG/2ML IJ SOLN
4.0000 mg | Freq: Once | INTRAMUSCULAR | Status: AC
  Administered 2018-05-04: 4 mg via INTRAVENOUS
  Filled 2018-05-04: qty 2

## 2018-05-04 NOTE — ED Notes (Signed)
ED TO INPATIENT HANDOFF REPORT  ED Nurse Name and Phone #: Tray [place], [phone]  S Name/Age/Gender Omar Rodriguez 28 y.o. male Room/Bed: 028C/028C  Code Status   Code Status: Prior  Home/SNF/Other Home Patient oriented to: self, place, time and situation Is this baseline? Yes   Triage Complete: Triage complete  Chief Complaint abd pain   Triage Note Pt c/o RUQ pain x's 4 days  Pt was seen in [place] and told he needs his gallbladder out.  Pt's father st's pt can not get in to see surgeon until 3/31.  Pt c/o pain and unable to keep anything down   Allergies Allergies  Allergen Reactions  . Ceclor [Cefaclor] Hives    Level of Care/Admitting Diagnosis ED Disposition    ED Disposition Condition Comment   Admit  [facility] [[id number]]  Level of Care: Med-Surg [16]  Diagnosis: Calculous cholecystitis [253664]  Admitting Physician: CCS, MD [[id number]]  Attending Physician: CCS, MD [[id number]]  PT Class (Do Not Modify): Observation [104]  PT Acc Code (Do Not Modify): Observation [10022]       B Medical/Surgery History Past Medical History:  Diagnosis Date  . ADHD (attention deficit hyperactivity disorder)   . Gallbladder disease   . Pneumonia    Past Surgical History:  Procedure Laterality Date  . 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     A IV Location/Drains/Wounds Patient Lines/Drains/Airways Status   Active Line/Drains/Airways    Name:   Placement date:   Placement time:   Site:   Days:   Peripheral IV 05/04/18 Right Hand   05/04/18    0339    Hand   less than 1   Peripheral IV 05/04/18 Right Forearm   05/04/18    0612    Forearm   less than 1          Intake/Output Last 24 hours No intake or output data in the 24 hours ending 05/04/18 0802  Labs/Imaging Results for orders placed or performed during the hospital encounter of 05/04/18 (from the past 48 hour(s))  Lipase, blood     Status: None   Collection Time: 05/04/18   1:44 AM  Result Value Ref Range   Lipase 50 11 - 51 U/L    Comment: Performed at [facility], [address], [place], [place] [address]  Comprehensive metabolic panel     Status: Abnormal   Collection Time: 05/04/18  1:44 AM  Result Value Ref Range   Sodium 133 (L) 135 - 145 mmol/L   Potassium 3.7 3.5 - 5.1 mmol/L   Chloride 98 98 - 111 mmol/L   CO2 25 22 - 32 mmol/L   Glucose, Bld 94 70 - 99 mg/dL   BUN 7 6 - 20 mg/dL   Creatinine, Ser 4.25 (H) 0.61 - 1.24 mg/dL   Calcium 6.8 (L) 8.9 - 10.3 mg/dL   Total Protein 6.5 6.5 - 8.1 g/dL   Albumin 3.1 (L) 3.5 - 5.0 g/dL   AST 956 (H) 15 - 41 U/L   ALT 339 (H) 0 - 44 U/L   Alkaline Phosphatase 232 (H) 38 - 126 U/L   Total Bilirubin 7.8 (H) 0.3 - 1.2 mg/dL   GFR calc non Af Amer >60 >60 mL/min   GFR calc Af Amer >60 >60 mL/min   Anion gap 10 5 - 15    Comment: Performed at [facility], [address] [place],  [place] [address]  CBC     Status: Abnormal   Collection Time: 05/04/18  1:44 AM  Result Value Ref Range   WBC 10.2 4.0 - 10.5 K/uL   RBC 4.80 4.22 - 5.81 MIL/uL   Hemoglobin 13.8 13.0 - 17.0 g/dL   HCT 25.6 38.9 - 37.3 %   MCV 87.5 80.0 - 100.0 fL   MCH 28.8 26.0 - 34.0 pg   MCHC 32.9 30.0 - 36.0 g/dL   RDW 42.8 76.8 - 11.5 %   Platelets 135 (L) 150 - 400 K/uL   nRBC 0.0 0.0 - 0.2 %    Comment: Performed at [facility], [address] [place], [place] [address]   US Abdomen Limited  Result Date: 05/04/2018 CLINICAL DATA:  Right upper quadrant pain with elevated AST and ALT EXAM: ULTRASOUND ABDOMEN LIMITED RIGHT UPPER QUADRANT COMPARISON:  None. FINDINGS: Gallbladder: A single large gallstone is noted measuring 2.1 cm. Gallbladder wall is thickened to 6 mm without striation or pericholecystic edema. Negative Murphy sign. Common bile duct: Diameter: 5-6 mm. Where visualized, no filling defect. On a few images portal triads are somewhat prominent such that intrahepatic biliary dilatation is a  consideration. Liver: No focal lesion identified. Within normal limits in parenchymal echogenicity. Portal vein is patent on color Doppler imaging with normal direction of blood flow towards the liver. IMPRESSION: 1. Cholelithiasis. Equivocal for cholecystitis as there is gallbladder wall thickening without reported Murphy sign or over distension. The thickening could be reactive. 2. Normal common bile duct diameter. 3. Somewhat prominent portal triads which may be a sign of hepatitis or mild intrahepatic biliary dilatation. Given labs, consider MRI. Electronically Signed   By: Marnee Spring M.D.   On: 05/04/2018 04:40    Pending Labs Unresulted Labs (From admission, onward)    Start     Ordered   05/04/18 0136  Urinalysis, Routine w reflex microscopic  ONCE - STAT,   STAT     05/04/18 0135   Signed and Held  HIV antibody (Routine Testing)  Tomorrow morning,   R     Signed and Held   Signed and Held  Comprehensive metabolic panel  Tomorrow morning,   R     Signed and Held   Signed and Held  CBC  Tomorrow morning,   R     Signed and Held          Vitals/Pain Today's Vitals   05/04/18 0236 05/04/18 0320 05/04/18 0326 05/04/18 0330  BP:   109/89 (!) 104/52  Pulse:   100 (!) 101  Resp:      Temp:  99.6 F (37.6 C)    TempSrc:  Oral    SpO2:   95% 93%  Weight:      Height:      PainSc: 0-No pain       Isolation Precautions No active isolations  Medications Medications  sodium chloride flush (NS) 0.9 % injection 3 mL (has no administration in time range)  sodium chloride 0.9 % bolus 1,000 mL (has no administration in time range)  sodium chloride 0.9 % bolus 1,000 mL (1,000 mLs Intravenous New Bag/Given 05/04/18 0358)  ondansetron (ZOFRAN) injection 4 mg (4 mg Intravenous Given 05/04/18 0359)  fentaNYL (SUBLIMAZE) injection 50 mcg (50 mcg Intravenous Given 05/04/18 0400)  ciprofloxacin (CIPRO) IVPB 400 mg (400 mg Intravenous New Bag/Given 05/04/18 0612)    And  metroNIDAZOLE  (FLAGYL) IVPB 500 mg (500 mg Intravenous New Bag/Given 05/04/18 3559)  Mobility walks Low fall risk   Focused Assessments Pulmonary Assessment Handoff:  Lung sounds:   O2 Device: Room Air        R Recommendations: See Admitting Provider Note  Report given to:   Additional Notes:

## 2018-05-04 NOTE — ED Triage Notes (Signed)
Pt c/o RUQ pain x's 4 days  Pt was seen in [place] and told he needs his gallbladder out.  Pt's father st's pt can not get in to see surgeon until 3/31.  Pt c/o pain and unable to keep anything down

## 2018-05-04 NOTE — Progress Notes (Signed)
MRCP reviewed. Limited due to motion artifact. Trace bilateral pleural effusions, trace ascites, concern of passive congestion vs hepatitis of liver parenchyma. Periportal edema. Gallstone noted and mild gb wall thickening favored to be reactive. No biliary dilation or evidence of choledocholithiasis on this limited study. Splenomegaly nearly 17cm. Again noted are elevated alk phos 232, ast/ alt 165/339, tbili 7.8.   These findings do not seem consistent with simple acute cholecystitis although it is feasible the LFTs reflect compression/ mirizzi; the splenomegaly and periportal edema/ liver parenchymal abnormalities are concerning.  -Hepatitis panel ordered -Repeat labs in AM -GI consult requested, Dr. Therisa Doyne will see.

## 2018-05-04 NOTE — Progress Notes (Signed)
Pt back from MRI 

## 2018-05-04 NOTE — Progress Notes (Signed)
Pt transfer to MRI. 

## 2018-05-04 NOTE — Progress Notes (Signed)
Pt new admit from ED, alert and oriented abdominal pain, for MRCP, NPO , father at the bedside.

## 2018-05-04 NOTE — Consult Note (Signed)
Referring Provider:  Dr. Fredricka Bonine Primary Care Physician:  Patient, No Pcp Per Primary Gastroenterologist: Gentry Fitz  Reason for Consultation: Abnormal LFTs  HPI: Omar Rodriguez is a 28 y.o. male with past medical history of ADHD presented to the hospital with abdominal pain.  Upon initial evaluation in the emergency room today he was found to have elevated LFTs with total bilirubin of 7.8.  Ultrasound abdomen showed reactive thickening of the gallbladder, normal common bile duct as well as somewhat prominent portal triads.  Follow-up MRI MRCP showed cholelithiasis and nonspecific gallbladder wall thickening and pericholecystic fluid without any biliary dilation or choledocholithiasis.  MRI also showed periportal edema concerning for passive congestion or hepatitis.  Moderate splenomegaly.  GI is consulted for further evaluation.  Patient seen and examined at bedside.  His father is also at the bedside.  He is complaining of acute worsening of abdominal pain.  Which he described as sharp epigastric pain without any radiation.  Pain is worse with food intake and his not able to tolerate any oral intake because of nausea and vomiting.  Denies diarrhea or constipation.  Denies any blood in the stool or black stool.  Denies reflux, dysphagia and odynophagia.  Patient also told me that he was diagnosed with flu last month and he has been having intermittent nausea and vomiting since that time.  No previous EGD or colonoscopy.  No family history of colon cancer.  Past Medical History:  Diagnosis Date  . ADHD (attention deficit hyperactivity disorder)   . Gallbladder disease   . Pneumonia     Past Surgical History:  Procedure Laterality Date  . 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 Authorizing Provider  azithromycin (ZITHROMAX) 250 MG tablet Take 250-500 mg by mouth See admin instructions. Take 2 tablets on day 1  then take 1 tablet daily for 4 days 04/29/18  Yes [provider]  dicyclomine (BENTYL) 20 MG tablet Take 20 mg by mouth 4 (four) times daily. 04/30/18  Yes [provider]  oxyCODONE (OXY IR/ROXICODONE) 5 MG immediate release tablet Take 5 mg by mouth 4 (four) times daily as needed for moderate pain.  05/03/18  Yes [provider]  pantoprazole (PROTONIX) 40 MG tablet Take 40 mg by mouth daily. 05/03/18  Yes [provider]  predniSONE (DELTASONE) 50 MG tablet Take 50 mg by mouth daily. 04/29/18  Yes [provider]  naproxen (NAPROSYN) 500 MG tablet Take 1 tablet (500 mg total) by mouth 2 (two) times daily with a meal. Patient not taking: Reported on 05/04/2018 05/15/14   Triplett, Tammy, PA-C    Scheduled Meds: . pantoprazole (PROTONIX) IV  40 mg Intravenous QHS  . sodium chloride flush  3 mL Intravenous Once   Continuous Infusions: . ciprofloxacin 400 mg (05/04/18 1148)   And  . metronidazole 500 mg (05/04/18 1130)  . dextrose 5 % and 0.45 % NaCl with KCl 20 mEq/L 125 mL/hr at 05/04/18 1129  . sodium chloride     PRN Meds:.acetaminophen **OR** acetaminophen, diphenhydrAMINE **OR** diphenhydrAMINE, morphine injection, ondansetron **OR** ondansetron (ZOFRAN) IV, oxyCODONE  Allergies as of 05/04/2018 - Review Complete 05/04/2018  Allergen Reaction Noted  . Ceclor [cefaclor] Hives 01/12/2014    Family History  Problem Relation Age of Onset  . Diabetes Father   . Hypertension Father     Social History   Socioeconomic History  . Marital  status: Single    Spouse name: Not on file  . Number of children: Not on file  . Years of education: Not on file  . Highest education level: Not on file  Occupational History  . Not on file  Social Needs  . Financial resource strain: Not on file  . Food insecurity:    Worry: Not on file    Inability: Not on file  . Transportation needs:    Medical: Not on file    Non-medical: Not on file  Tobacco  Use  . Smoking status: Never Smoker  . Smokeless tobacco: Never Used  Substance and Sexual Activity  . Alcohol use: No  . Drug use: No  . Sexual activity: Not on file  Lifestyle  . Physical activity:    Days per week: Not on file    Minutes per session: Not on file  . Stress: Not on file  Relationships  . Social connections:    Talks on phone: Not on file    Gets together: Not on file    Attends religious service: Not on file    Active member of club or organization: Not on file    Attends meetings of clubs or organizations: Not on file    Relationship status: Not on file  . Intimate partner violence:    Fear of current or ex partner: Not on file    Emotionally abused: Not on file    Physically abused: Not on file    Forced sexual activity: Not on file  Other Topics Concern  . Not on file  Social History Narrative  . Not on file    Review of Systems: Review of Systems  Constitutional: Negative for chills and fever.  HENT: Negative for hearing loss and tinnitus.   Eyes: Negative for blurred vision and double vision.  Respiratory: Negative for cough, hemoptysis and sputum production.   Cardiovascular: Negative for chest pain, palpitations and orthopnea.  Gastrointestinal: Positive for abdominal pain, nausea and vomiting. Negative for blood in stool, constipation, diarrhea, heartburn and melena.  Genitourinary: Negative for dysuria and urgency.  Musculoskeletal: Positive for joint pain and myalgias.  Skin: Negative for itching and rash.  Neurological: Negative for seizures and loss of consciousness.  Endo/Heme/Allergies: Does not bruise/bleed easily.  Psychiatric/Behavioral: Negative for hallucinations and suicidal ideas.    Physical Exam: Vital signs: Vitals:   05/04/18 0849 05/04/18 1122  BP: (!) 126/55 97/73  Pulse: 95 97  Resp: 17 19  Temp: 99 F (37.2 C) 98.4 F (36.9 C)  SpO2: 97% 97%     Physical Exam  Constitutional: He is oriented to person, place,  and time. He appears well-developed and well-nourished. No distress.  HENT:  Head: Normocephalic and atraumatic.  Poor oral hygiene.  Dry lips.  Poor dental hygiene.  Eyes: EOM are normal. Scleral icterus is present.  Neck: Normal range of motion. Neck supple.  Cardiovascular: Normal rate, regular rhythm and normal heart sounds.  Pulmonary/Chest: Effort normal and breath sounds normal. No respiratory distress.  Abdominal: Soft. Bowel sounds are normal. He exhibits no distension. There is abdominal tenderness. There is no rebound and no guarding.  Epigastric and right upper quadrant tenderness to palpation  Musculoskeletal: Normal range of motion.        General: No edema.  Neurological: He is alert and oriented to person, place, and time.  Skin: Skin is dry. No erythema.  Psychiatric: He has a normal mood and affect. Judgment normal.  Vitals reviewed.  GI:  Lab Results: Recent Labs    05/04/18 0144  WBC 10.2  HGB 13.8  HCT 42.0  PLT 135*   BMET Recent Labs    05/04/18 0144  NA 133*  K 3.7  CL 98  CO2 25  GLUCOSE 94  BUN 7  CREATININE 1.36*  CALCIUM 6.8*   LFT Recent Labs    05/04/18 0144  PROT 6.5  ALBUMIN 3.1*  AST 165*  ALT 339*  ALKPHOS 232*  BILITOT 7.8*   PT/INR No results for input(s): LABPROT, INR in the last 72 hours.   Studies/Results: Mr 3d Recon At Scanner  Result Date: 05/04/2018 CLINICAL DATA:  Right upper quadrant abdominal pain with dilated liver function studies and cholelithiasis. EXAM: MRI ABDOMEN WITHOUT AND WITH CONTRAST (INCLUDING MRCP) TECHNIQUE: Multiplanar multisequence MR imaging of the abdomen was performed both before and after the administration of intravenous contrast. Heavily T2-weighted images of the biliary and pancreatic ducts were obtained, and three-dimensional MRCP images were rendered by post processing. CONTRAST:  8 cc Gadavist COMPARISON:  Abdominal ultrasound 05/04/2018.  Chest CT 01/13/2014. FINDINGS: Despite efforts  by the technologist and patient, moderate motion artifact is present on today's exam and could not be eliminated. This reduces exam sensitivity and specificity. The thin section MRCP images are essentially nondiagnostic. Lower chest: Trace bilateral pleural effusions. The visualized lung bases are otherwise unremarkable. Hepatobiliary: No significant steatosis or morphologic changes of cirrhosis. On the immediate postcontrast images, there is mildly heterogeneous parenchymal enhancement, suggesting passive congestion. This is associated with mild periportal edema. No focal lesions are identified on delayed imaging. As on ultrasound, there is a 16 mm gallstone and mild gallbladder wall thickening. There is a small amount of pericholecystic fluid. Although the MRCP images are limited, there is no biliary dilatation or evidence of choledocholithiasis. Pancreas: Unremarkable. No pancreatic ductal dilatation or surrounding inflammatory changes. Spleen: Measures 16.6 x 16.0 x 7.8 cm (volume = 1100 cm^3) consistent with moderate splenomegaly. No focal abnormality. Adrenals/Urinary Tract: Both adrenal glands appear normal. The kidneys and proximal ureters appear normal. No hydronephrosis. Stomach/Bowel: No evidence of bowel wall thickening, distention or surrounding inflammatory change. Vascular/Lymphatic: There are few scattered prominent mesenteric lymph nodes which are likely reactive. No retroperitoneal adenopathy. No significant vascular findings. The portal, superior mesenteric and splenic veins are patent. Other: Mild mesenteric edema and trace perihepatic ascites. No focal extraluminal fluid collection. Musculoskeletal: No acute or significant osseous findings. IMPRESSION: 1. Cholelithiasis with nonspecific mild gallbladder wall thickening and pericholecystic fluid. The gallbladder wall thickening is likely secondary to adjacent inflammation. 2. No evidence of biliary dilatation or choledocholithiasis. MRCP images  are limited. 3. Periportal edema and heterogeneous early hepatic parenchymal enhancement, most consistent with chronic passive congestion or hepatitis. No focal hepatic abnormality. 4. Trace ascites and bilateral pleural effusions. 5. Moderate splenomegaly. Electronically Signed   By: Carey Bullocks M.D.   On: 05/04/2018 11:43   US Abdomen Limited  Result Date: 05/04/2018 CLINICAL DATA:  Right upper quadrant pain with elevated AST and ALT EXAM: ULTRASOUND ABDOMEN LIMITED RIGHT UPPER QUADRANT COMPARISON:  None. FINDINGS: Gallbladder: A single large gallstone is noted measuring 2.1 cm. Gallbladder wall is thickened to 6 mm without striation or pericholecystic edema. Negative Murphy sign. Common bile duct: Diameter: 5-6 mm. Where visualized, no filling defect. On a few images portal triads are somewhat prominent such that intrahepatic biliary dilatation is a consideration. Liver: No focal lesion identified. Within normal limits in parenchymal echogenicity. Portal vein is patent on color  Doppler imaging with normal direction of blood flow towards the liver. IMPRESSION: 1. Cholelithiasis. Equivocal for cholecystitis as there is gallbladder wall thickening without reported Murphy sign or over distension. The thickening could be reactive. 2. Normal common bile duct diameter. 3. Somewhat prominent portal triads which may be a sign of hepatitis or mild intrahepatic biliary dilatation. Given labs, consider MRI. Electronically Signed   By: Marnee Spring M.D.   On: 05/04/2018 04:40   Mr Abdomen Mrcp Vivien Rossetti Contast  Result Date: 05/04/2018 CLINICAL DATA:  Right upper quadrant abdominal pain with dilated liver function studies and cholelithiasis. EXAM: MRI ABDOMEN WITHOUT AND WITH CONTRAST (INCLUDING MRCP) TECHNIQUE: Multiplanar multisequence MR imaging of the abdomen was performed both before and after the administration of intravenous contrast. Heavily T2-weighted images of the biliary and pancreatic ducts were  obtained, and three-dimensional MRCP images were rendered by post processing. CONTRAST:  8 cc Gadavist COMPARISON:  Abdominal ultrasound 05/04/2018.  Chest CT 01/13/2014. FINDINGS: Despite efforts by the technologist and patient, moderate motion artifact is present on today's exam and could not be eliminated. This reduces exam sensitivity and specificity. The thin section MRCP images are essentially nondiagnostic. Lower chest: Trace bilateral pleural effusions. The visualized lung bases are otherwise unremarkable. Hepatobiliary: No significant steatosis or morphologic changes of cirrhosis. On the immediate postcontrast images, there is mildly heterogeneous parenchymal enhancement, suggesting passive congestion. This is associated with mild periportal edema. No focal lesions are identified on delayed imaging. As on ultrasound, there is a 16 mm gallstone and mild gallbladder wall thickening. There is a small amount of pericholecystic fluid. Although the MRCP images are limited, there is no biliary dilatation or evidence of choledocholithiasis. Pancreas: Unremarkable. No pancreatic ductal dilatation or surrounding inflammatory changes. Spleen: Measures 16.6 x 16.0 x 7.8 cm (volume = 1100 cm^3) consistent with moderate splenomegaly. No focal abnormality. Adrenals/Urinary Tract: Both adrenal glands appear normal. The kidneys and proximal ureters appear normal. No hydronephrosis. Stomach/Bowel: No evidence of bowel wall thickening, distention or surrounding inflammatory change. Vascular/Lymphatic: There are few scattered prominent mesenteric lymph nodes which are likely reactive. No retroperitoneal adenopathy. No significant vascular findings. The portal, superior mesenteric and splenic veins are patent. Other: Mild mesenteric edema and trace perihepatic ascites. No focal extraluminal fluid collection. Musculoskeletal: No acute or significant osseous findings. IMPRESSION: 1. Cholelithiasis with nonspecific mild  gallbladder wall thickening and pericholecystic fluid. The gallbladder wall thickening is likely secondary to adjacent inflammation. 2. No evidence of biliary dilatation or choledocholithiasis. MRCP images are limited. 3. Periportal edema and heterogeneous early hepatic parenchymal enhancement, most consistent with chronic passive congestion or hepatitis. No focal hepatic abnormality. 4. Trace ascites and bilateral pleural effusions. 5. Moderate splenomegaly. Electronically Signed   By: Carey Bullocks M.D.   On: 05/04/2018 11:43    Impression/Plan: -Abnormal LFTs /  jaundice.  Total bilirubin 7.8.  AST 165, ALT 339, alkaline phosphatase 232.  Findings concerning for possible acute hepatitis given MRI finding of peri-portal edema and possible passive congestion. ?? EBV infection given recent upper respiratory tract symptoms. -Nonspecific gallbladder wall thickening. -Epigastric abdominal pain.  Normal lipase. - self reported  history of recent flu 1 month ago.  Recommendations ------------------------ - Follow  hepatitis panel. - Check HCV PCR - Check EBV panel - Monitor LFTs and INR. - Urine drug screen - GI will follow   LOS: 0 days   Kathi Der  MD, FACP 05/04/2018, 12:07 PM  Contact #  [phone]

## 2018-05-04 NOTE — ED Provider Notes (Signed)
[facility] EMERGENCY DEPARTMENT Provider Note   CSN: [id number] Arrival date & time: 05/04/18  0107    History   Chief Complaint Chief Complaint  Patient presents with  . Abdominal Pain    HPI Omar Rodriguez is a 28 y.o. male with a hx of ADHD, pneumonia, gallstones presents to the Emergency Department complaining of gradual, persistent, progressively worsening epigastric and right upper quadrant abdominal pain onset 4 days ago.  He reports he has been seen in the emergency department in [place] each day for the last 4 days with worsening symptoms.  He reports worsening pain, persistent vomiting and worsening lab work.  He reports that yesterday the hospital told him that there was something wrong with his liver but he was unclear what this meant.  He is unable to provide baseline labs from that visit and I do not see any within the epic chart.  Reports his pain is 7/10.  He reports he vomits anytime he attempts to eat or drink anything.  He has been unable to keep down his pain medications.  He denies headache, neck pain, chest pain, shortness of breath, weakness, dizziness, syncope, dysuria, hematuria.     The history is provided by the patient and medical records. No language interpreter was used.    Past Medical History:  Diagnosis Date  . ADHD (attention deficit hyperactivity disorder)   . Gallbladder disease   . Pneumonia     Patient Active Problem List   Diagnosis Date Noted  . Acute respiratory failure ([place])   . Sepsis ([place]) 01/13/2014  . Acute respiratory failure with hypoxia ([place]) 01/12/2014  . Acute renal insufficiency 01/12/2014  . Dehydration 01/12/2014  . CAP (community acquired pneumonia) 01/12/2014  . UTI (lower urinary tract infection) 01/12/2014    Class: Question of    Past Surgical History:  Procedure Laterality Date  . 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 Authorizing Provider  azithromycin (ZITHROMAX) 250 MG tablet Take 250-500 mg by mouth See admin instructions. Take 2 tablets on day 1 then take 1 tablet daily for 4 days 04/29/18  Yes [provider]  dicyclomine (BENTYL) 20 MG tablet Take 20 mg by mouth 4 (four) times daily. 04/30/18  Yes [provider]  oxyCODONE (OXY IR/ROXICODONE) 5 MG immediate release tablet Take 5 mg by mouth 4 (four) times daily as needed for moderate pain.  05/03/18  Yes [provider]  pantoprazole (PROTONIX) 40 MG tablet Take 40 mg by mouth daily. 05/03/18  Yes [provider]  predniSONE (DELTASONE) 50 MG tablet Take 50 mg by mouth daily. 04/29/18  Yes [provider]  naproxen (NAPROSYN) 500 MG tablet Take 1 tablet (500 mg total) by mouth 2 (two) times daily with a meal. Patient not taking: Reported on 05/04/2018 05/15/14   Kem Parkinson, PA-C    Family History Family History  Problem Relation Age of Onset  . Diabetes Father   . Hypertension Father     Social History Social History   Tobacco Use  . Smoking status: Never Smoker  . Smokeless tobacco: Never Used  Substance Use Topics  . Alcohol use: No  . Drug use: No     Allergies   Ceclor [cefaclor]   Review of Systems Review of Systems  Constitutional: Negative for appetite change,  diaphoresis, fatigue, fever and unexpected weight change.  HENT: Negative for mouth sores.   Eyes: Negative for visual disturbance.  Respiratory: Negative for cough, chest tightness, shortness of breath and wheezing.   Cardiovascular: Negative for chest pain.  Gastrointestinal: Positive for abdominal pain, nausea and vomiting. Negative for constipation and diarrhea.  Endocrine: Negative for polydipsia, polyphagia and polyuria.  Genitourinary: Negative for dysuria, frequency, hematuria and urgency.  Musculoskeletal: Negative for back pain and neck stiffness.  Skin: Negative for  rash.  Allergic/Immunologic: Negative for immunocompromised state.  Neurological: Negative for syncope, light-headedness and headaches.  Hematological: Does not bruise/bleed easily.  Psychiatric/Behavioral: Negative for sleep disturbance. The patient is not nervous/anxious.      Physical Exam Updated Vital Signs BP (!) 104/52   Pulse (!) 101   Temp 99.6 F (37.6 C) (Oral)   Resp 19   Ht '5\' 7"'  (1.702 m)   Wt 88.5 kg   SpO2 93%   BMI 30.54 kg/m   Physical Exam Vitals signs and nursing note reviewed.  Constitutional:      General: He is not in acute distress.    Appearance: He is well-developed. He is not diaphoretic.     Comments: Awake, alert, nontoxic appearance  HENT:     Head: Normocephalic and atraumatic.     Mouth/Throat:     Pharynx: No oropharyngeal exudate.  Eyes:     General: No scleral icterus.    Conjunctiva/sclera: Conjunctivae normal.  Neck:     Musculoskeletal: Normal range of motion and neck supple.  Cardiovascular:     Rate and Rhythm: Regular rhythm. Tachycardia present.  Pulmonary:     Effort: Pulmonary effort is normal. No respiratory distress.     Breath sounds: Normal breath sounds. No wheezing.  Abdominal:     General: Bowel sounds are normal.     Palpations: Abdomen is soft. There is no mass.     Tenderness: There is abdominal tenderness in the right upper quadrant. There is guarding. There is no right CVA tenderness, left CVA tenderness or rebound. Positive signs include Murphy's sign.  Musculoskeletal: Normal range of motion.  Skin:    General: Skin is warm and dry.  Neurological:     Mental Status: He is alert.     Comments: Speech is clear and goal oriented Moves extremities without ataxia      ED Treatments / Results  Labs (all labs ordered are listed, but only abnormal results are displayed) Labs Reviewed  COMPREHENSIVE METABOLIC PANEL - Abnormal; Notable for the following components:      Result Value   Sodium 133 (*)     Creatinine, Ser 1.36 (*)    Calcium 6.8 (*)    Albumin 3.1 (*)    AST 165 (*)    ALT 339 (*)    Alkaline Phosphatase 232 (*)    Total Bilirubin 7.8 (*)    All other components within normal limits  CBC - Abnormal; Notable for the following components:   Platelets 135 (*)    All other components within normal limits  LIPASE, BLOOD  URINALYSIS, ROUTINE W REFLEX MICROSCOPIC    Radiology US Abdomen Limited  Result Date: 05/04/2018 CLINICAL DATA:  Right upper quadrant pain with elevated AST and ALT EXAM: ULTRASOUND ABDOMEN LIMITED RIGHT UPPER QUADRANT COMPARISON:  None. FINDINGS: Gallbladder: A single large gallstone is noted measuring 2.1 cm. Gallbladder wall is thickened to 6 mm without striation or pericholecystic edema. Negative Murphy sign. Common bile duct: Diameter: 5-6 mm.  Where visualized, no filling defect. On a few images portal triads are somewhat prominent such that intrahepatic biliary dilatation is a consideration. Liver: No focal lesion identified. Within normal limits in parenchymal echogenicity. Portal vein is patent on color Doppler imaging with normal direction of blood flow towards the liver. IMPRESSION: 1. Cholelithiasis. Equivocal for cholecystitis as there is gallbladder wall thickening without reported Murphy sign or over distension. The thickening could be reactive. 2. Normal common bile duct diameter. 3. Somewhat prominent portal triads which may be a sign of hepatitis or mild intrahepatic biliary dilatation. Given labs, consider MRI. Electronically Signed   By: Monte Fantasia M.D.   On: 05/04/2018 04:40    Procedures Procedures (including critical care time)  Medications Ordered in ED Medications  sodium chloride flush (NS) 0.9 % injection 3 mL (has no administration in time range)  ciprofloxacin (CIPRO) IVPB 400 mg (has no administration in time range)    And  metroNIDAZOLE (FLAGYL) IVPB 500 mg (has no administration in time range)  sodium chloride 0.9 % bolus  1,000 mL (1,000 mLs Intravenous New Bag/Given 05/04/18 0358)  ondansetron (ZOFRAN) injection 4 mg (4 mg Intravenous Given 05/04/18 0359)  fentaNYL (SUBLIMAZE) injection 50 mcg (50 mcg Intravenous Given 05/04/18 0400)     Initial Impression / Assessment and Plan / ED Course  I have reviewed the triage vital signs and the nursing notes.  Pertinent labs & imaging results that were available during my care of the patient were reviewed by me and considered in my medical decision making (see chart for details).  Clinical Course as of May 03 556  Fri May 04, 2018  0546 AST(!): 165 [HM]  [phone] Discussed with Dr. Redmond Pulling who will evaluate and admit.    [HM]  0556 ALT(!): 339 [HM]  0556 Alkaline Phosphatase(!): 232 [HM]  0556 Total Bilirubin(!): 7.8 [HM]  0556 Slightly improved from previous hospitalization (pt was septic at that time).  Fluids given  Creatinine(!): 1.36 [HM]    Clinical Course User Index [HM] Jonluke Cobbins, Jarrett Soho, PA-C       Known gallstones and right upper quadrant abdominal pain.  Significant elevation in AST and ALT at 165 and 339 respectively.  Additionally with elevation in alk phos at 232 and total bili at 7.8.  Patient's lipase is within normal limits.  He does have an elevated creatinine 1.36 however I suspect this is secondary to dehydration as he has been vomiting for 4 days.  Patient is tachycardic.  No h for "ypotension.  Fluids and pain medicine given.  Ultrasound does show gallstones and gallbladder wall thickening.  This is concerning for mecystitis given patient's story and exam.  Will consult with general surgery.  The patient was discussed with Dr. Ellender Hose who agrees with the treatment plan.  5:58 AM Discussed with Dr. Redmond Pulling who will evaluate and admit.   Final Clinical Impressions(s) / ED Diagnoses   Final diagnoses:  Cholecystitis  Right upper quadrant abdominal pain  Intractable vomiting with nausea, unspecified vomiting type  AKI (acute kidney  injury) [facility])    ED Discharge Orders    None       Agapito Games 05/04/18 0962    Duffy Bruce, MD 05/05/18 [phone]

## 2018-05-04 NOTE — H&P (Signed)
Omar Rodriguez is an 28 y.o. male.   Chief Complaint: Abdominal pain HPI: 28 year old gentleman with a history of ADHD and a remote history of pneumonia was brought to the emergency room by his father because of ongoing worsening abdominal pain.  He states for the past 4 to 5 days he has had persistent abdominal pain mainly in his upper abdomen and he points around his umbilical as well as right upper quadrant area.  He went to the emergency room in [place] daily for the past several days for evaluation and was discharged yesterday and told to follow-up with general surgery.  His father states that he was told that he had a gallstone.  He states that they could not get an appointment to March 31.  When the patient could no longer keep any liquids down or food down he brought him to this emergency room.  He denies any fevers or chills.  He denies any prior history of abdominal pain prior to this episode that started about 4 to 5 days ago.  His last bowel movement was yesterday.  He denies any acholic stools.  He denies any unplanned weight loss.  He denies any other medical history other than a remote history of pneumonia and ADHD  Past Medical History:  Diagnosis Date  . ADHD (attention deficit hyperactivity disorder)   . Gallbladder disease   . Pneumonia     Past Surgical History:  Procedure Laterality Date  . 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 Diabetes Father   . Hypertension Father    Social History:  reports that he has never smoked. He has never used smokeless tobacco. He reports that he does not drink alcohol or use drugs.  Allergies:  Allergies  Allergen Reactions  . Ceclor [Cefaclor] Hives    (Not in a hospital admission)   Results for orders placed or performed during the hospital encounter of 05/04/18 (from the past 48 hour(s))  Lipase, blood     Status: None   Collection Time: 05/04/18  1:44 AM  Result  Value Ref Range   Lipase 50 11 - 51 U/L    Comment: Performed at [facility], [address], [place], [place] [address]  Comprehensive metabolic panel     Status: Abnormal   Collection Time: 05/04/18  1:44 AM  Result Value Ref Range   Sodium 133 (L) 135 - 145 mmol/L   Potassium 3.7 3.5 - 5.1 mmol/L   Chloride 98 98 - 111 mmol/L   CO2 25 22 - 32 mmol/L   Glucose, Bld 94 70 - 99 mg/dL   BUN 7 6 - 20 mg/dL   Creatinine, Ser 1.44 (H) 0.61 - 1.24 mg/dL   Calcium 6.8 (L) 8.9 - 10.3 mg/dL   Total Protein 6.5 6.5 - 8.1 g/dL   Albumin 3.1 (L) 3.5 - 5.0 g/dL   AST 818 (H) 15 - 41 U/L   ALT 339 (H) 0 - 44 U/L   Alkaline Phosphatase 232 (H) 38 - 126 U/L   Total Bilirubin 7.8 (H) 0.3 - 1.2 mg/dL   GFR calc non Af Amer >60 >60 mL/min   GFR calc Af Amer >60 >60 mL/min   Anion gap 10 5 - 15    Comment: Performed at [facility], [address] [place], [place] [address]  CBC     Status: Abnormal  Collection Time: 05/04/18  1:44 AM  Result Value Ref Range   WBC 10.2 4.0 - 10.5 K/uL   RBC 4.80 4.22 - 5.81 MIL/uL   Hemoglobin 13.8 13.0 - 17.0 g/dL   HCT 39.0 30.0 - 92.3 %   MCV 87.5 80.0 - 100.0 fL   MCH 28.8 26.0 - 34.0 pg   MCHC 32.9 30.0 - 36.0 g/dL   RDW 30.0 76.2 - 26.3 %   Platelets 135 (L) 150 - 400 K/uL   nRBC 0.0 0.0 - 0.2 %    Comment: Performed at [facility], [address], [place], [place] [address]   US Abdomen Limited  Result Date: 05/04/2018 CLINICAL DATA:  Right upper quadrant pain with elevated AST and ALT EXAM: ULTRASOUND ABDOMEN LIMITED RIGHT UPPER QUADRANT COMPARISON:  None. FINDINGS: Gallbladder: A single large gallstone is noted measuring 2.1 cm. Gallbladder wall is thickened to 6 mm without striation or pericholecystic edema. Negative Murphy sign. Common bile duct: Diameter: 5-6 mm. Where visualized, no filling defect. On a few images portal triads are somewhat prominent such that intrahepatic biliary dilatation is a consideration. Liver: No focal  lesion identified. Within normal limits in parenchymal echogenicity. Portal vein is patent on color Doppler imaging with normal direction of blood flow towards the liver. IMPRESSION: 1. Cholelithiasis. Equivocal for cholecystitis as there is gallbladder wall thickening without reported Murphy sign or over distension. The thickening could be reactive. 2. Normal common bile duct diameter. 3. Somewhat prominent portal triads which may be a sign of hepatitis or mild intrahepatic biliary dilatation. Given labs, consider MRI. Electronically Signed   By: Marnee Spring M.D.   On: 05/04/2018 04:40    Review of Systems  Constitutional: Negative for chills, fever and weight loss.  Respiratory: Negative for cough and shortness of breath.   Gastrointestinal: Positive for abdominal pain, nausea and vomiting.  Skin: Negative for rash.  All other systems reviewed and are negative.   Blood pressure (!) 104/52, pulse (!) 101, temperature 99.6 F (37.6 C), temperature source Oral, resp. rate 19, height 5\' 7"  (1.702 m), weight 88.5 kg, SpO2 93 %. Physical Exam  Vitals reviewed. Constitutional: He is oriented to person, place, and time. Vital signs are normal. He appears well-developed and well-nourished. He appears ill.  HENT:  Head: Normocephalic and atraumatic.  Right Ear: External ear normal.  Left Ear: External ear normal.  Mouth/Throat: Dental caries present.  Eyes: Conjunctivae are normal. No scleral icterus.  Neck: Normal range of motion. Neck supple. No tracheal deviation present. No thyromegaly present.  Cardiovascular: Normal rate and normal heart sounds.  Respiratory: Effort normal and breath sounds normal. No stridor. No respiratory distress. He has no wheezes.  GI: Soft. Normal appearance. There is abdominal tenderness in the right upper quadrant and periumbilical area. There is guarding (voluntary). There is no rigidity and no rebound.  Musculoskeletal:        General: No tenderness or edema.   Lymphadenopathy:    He has no cervical adenopathy.  Neurological: He is alert and oriented to person, place, and time. He exhibits normal muscle tone.  Skin: Skin is warm and dry. No rash noted. He is not diaphoretic. No erythema. No pallor.  Psychiatric: He has a normal mood and affect. His behavior is normal. Judgment and thought content normal.     Assessment/Plan Acute calculous cholecystitis Elevated LFTs ADHD Mild dehydration  I believe the patient's symptoms are consistent with gallbladder disease.  We discussed gallbladder disease.  I discussed laparoscopic cholecystectomy with IOC in detail.  The patient was shown diagrams detailing the procedure.  We discussed the risks and benefits of a laparoscopic cholecystectomy including, but not limited to bleeding, infection, injury to surrounding structures such as the intestine or liver, bile leak, retained gallstones, need to convert to an open procedure, prolonged diarrhea, blood clots such as  DVT, common bile duct injury, anesthesia risks, and possible need for additional procedures.  We discussed the typical post-operative recovery course. I explained that the likelihood of improvement of their symptoms is good.  Did explain to him and his father that he would be at higher risk for conversion to open, injury to surrounding structures, bile leak and and need for additional procedures based on the chronicity of his symptoms  The timing and evaluation of surgery is up to my partner who is coming on for shift today.  He does have elevated LFTs and possibility of mild intrahepatic ductal dilatation which may reflect a common bile duct stone but my suspicion is low but I will ultimately defer remaining work-up and timing of surgery to Dr. Doylene Canard  Give another liter bolus Continue antibiotics  Mary Sella. Andrey Campanile, MD, FACS General, Bariatric, & Minimally Invasive Surgery [facility], [place]    Gaynelle Adu, MD 05/04/2018, 7:12  AM

## 2018-05-05 LAB — CBC
HCT: 37.5 % — ABNORMAL LOW (ref 39.0–52.0)
Hemoglobin: 12.6 g/dL — ABNORMAL LOW (ref 13.0–17.0)
MCH: 29.5 pg (ref 26.0–34.0)
MCHC: 33.6 g/dL (ref 30.0–36.0)
MCV: 87.8 fL (ref 80.0–100.0)
NRBC: 0 % (ref 0.0–0.2)
Platelets: 102 10*3/uL — ABNORMAL LOW (ref 150–400)
RBC: 4.27 MIL/uL (ref 4.22–5.81)
RDW: 13.8 % (ref 11.5–15.5)
WBC: 6.9 10*3/uL (ref 4.0–10.5)

## 2018-05-05 LAB — HCV AB W REFLEX TO QUANT PCR: HCV Ab: <0.1 s/co ratio (ref 0.0–0.9)

## 2018-05-05 LAB — COMPREHENSIVE METABOLIC PANEL
ALT: 215 U/L — ABNORMAL HIGH (ref 0–44)
AST: 99 U/L — ABNORMAL HIGH (ref 15–41)
Albumin: 2.5 g/dL — ABNORMAL LOW (ref 3.5–5.0)
Alkaline Phosphatase: 179 U/L — ABNORMAL HIGH (ref 38–126)
Anion gap: 9 (ref 5–15)
BUN: 8 mg/dL (ref 6–20)
CO2: 25 mmol/L (ref 22–32)
Calcium: 6.5 mg/dL — ABNORMAL LOW (ref 8.9–10.3)
Chloride: 101 mmol/L (ref 98–111)
Creatinine, Ser: 1.31 mg/dL — ABNORMAL HIGH (ref 0.61–1.24)
GFR calc Af Amer: >60 mL/min (ref >60)
GFR calc non Af Amer: >60 mL/min (ref >60)
Glucose, Bld: 107 mg/dL — ABNORMAL HIGH (ref 70–99)
Potassium: 4 mmol/L (ref 3.5–5.1)
Sodium: 135 mmol/L (ref 135–145)
Total Bilirubin: 6.6 mg/dL — ABNORMAL HIGH (ref 0.3–1.2)
Total Protein: 5.7 g/dL — ABNORMAL LOW (ref 6.5–8.1)

## 2018-05-05 LAB — HCV INTERPRETATION

## 2018-05-05 LAB — HEPATITIS PANEL, ACUTE
HCV Ab: <0.1 s/co ratio (ref 0.0–0.9)
Hep A IgM: NEGATIVE
Hep B C IgM: NEGATIVE
Hepatitis B Surface Ag: NEGATIVE

## 2018-05-05 LAB — HIV ANTIBODY (ROUTINE TESTING W REFLEX): HIV Screen 4th Generation wRfx: NONREACTIVE

## 2018-05-05 LAB — EPSTEIN-BARR VIRUS VCA, IGM: EBV VCA IgM: >160 U/mL — ABNORMAL HIGH (ref 0.0–35.9)

## 2018-05-05 NOTE — Progress Notes (Signed)
Subjective: Patient seen and examined at bedside.  Continues to have nausea and right-sided abdominal pain.  Objective: Vital signs in last 24 hours: Temp:  [98.3 F (36.8 C)-100.9 F (38.3 C)] 100.9 F (38.3 C) (03/21 0700) Pulse Rate:  [93-112] 112 (03/21 0700) Resp:  [18-19] 18 (03/21 0700) BP: (97-110)/(48-73) 110/59 (03/21 0700) SpO2:  [93 %-97 %] 94 % (03/21 0700) Weight change: -4.536 kg Last BM Date: 05/02/18  PE: Prominent icterus GENERAL: No pallor, dry mouth, poor dentition ABDOMEN: Mild generalized tenderness, more prominent in right upper quadrant EXTREMITIES: No deformity  Lab Results: Results for orders placed or performed during the hospital encounter of 05/04/18 (from the past 48 hour(s))  Lipase, blood     Status: None   Collection Time: 05/04/18  1:44 AM  Result Value Ref Range   Lipase 50 11 - 51 U/L    Comment: Performed at [facility], [address] [place], [place] [address]  Comprehensive metabolic panel     Status: Abnormal   Collection Time: 05/04/18  1:44 AM  Result Value Ref Range   Sodium 133 (L) 135 - 145 mmol/L   Potassium 3.7 3.5 - 5.1 mmol/L   Chloride 98 98 - 111 mmol/L   CO2 25 22 - 32 mmol/L   Glucose, Bld 94 70 - 99 mg/dL   BUN 7 6 - 20 mg/dL   Creatinine, Ser 4.78 (H) 0.61 - 1.24 mg/dL   Calcium 6.8 (L) 8.9 - 10.3 mg/dL   Total Protein 6.5 6.5 - 8.1 g/dL   Albumin 3.1 (L) 3.5 - 5.0 g/dL   AST 295 (H) 15 - 41 U/L   ALT 339 (H) 0 - 44 U/L   Alkaline Phosphatase 232 (H) 38 - 126 U/L   Total Bilirubin 7.8 (H) 0.3 - 1.2 mg/dL   GFR calc non Af Amer >60 >60 mL/min   GFR calc Af Amer >60 >60 mL/min   Anion gap 10 5 - 15    Comment: Performed at [facility], [address], [place], [place] [address]  CBC     Status: Abnormal   Collection Time: 05/04/18  1:44 AM  Result Value Ref Range   WBC 10.2 4.0 - 10.5 K/uL   RBC 4.80 4.22 - 5.81 MIL/uL   Hemoglobin 13.8 13.0 - 17.0 g/dL   HCT 86.5 78.4 - 69.6 %   MCV 87.5 80.0  - 100.0 fL   MCH 28.8 26.0 - 34.0 pg   MCHC 32.9 30.0 - 36.0 g/dL   RDW 29.5 28.4 - 13.2 %   Platelets 135 (L) 150 - 400 K/uL   nRBC 0.0 0.0 - 0.2 %    Comment: Performed at [facility], [address], [place], [place] [address]  Urinalysis, Routine w reflex microscopic     Status: Abnormal   Collection Time: 05/04/18  1:45 AM  Result Value Ref Range   Color, Urine AMBER (A) YELLOW    Comment: BIOCHEMICALS MAY BE AFFECTED BY COLOR   APPearance CLOUDY (A) CLEAR   Specific Gravity, Urine 1.031 (H) 1.005 - 1.030   pH 5.0 5.0 - 8.0   Glucose, UA NEGATIVE NEGATIVE mg/dL   Hgb urine dipstick MODERATE (A) NEGATIVE   Bilirubin Urine MODERATE (A) NEGATIVE   Ketones, ur NEGATIVE NEGATIVE mg/dL   Protein, ur 272 (A) NEGATIVE mg/dL   Nitrite NEGATIVE NEGATIVE   Leukocytes,Ua NEGATIVE NEGATIVE   RBC / HPF 0-5 0 - 5 RBC/hpf  WBC, UA 0-5 0 - 5 WBC/hpf   Bacteria, UA FEW (A) NONE SEEN   Mucus PRESENT    Amorphous Crystal PRESENT     Comment: Performed at [facility], [address] [place], [place] [address]  Urine rapid drug screen (hosp performed)     Status: Abnormal   Collection Time: 05/04/18  1:45 AM  Result Value Ref Range   Opiates POSITIVE (A) NONE DETECTED   Cocaine NONE DETECTED NONE DETECTED   Benzodiazepines NONE DETECTED NONE DETECTED   Amphetamines NONE DETECTED NONE DETECTED   Tetrahydrocannabinol NONE DETECTED NONE DETECTED   Barbiturates NONE DETECTED NONE DETECTED    Comment: (NOTE) DRUG SCREEN FOR MEDICAL PURPOSES ONLY.  IF CONFIRMATION IS NEEDED FOR ANY PURPOSE, NOTIFY LAB WITHIN 5 DAYS. LOWEST DETECTABLE LIMITS FOR URINE DRUG SCREEN Drug Class                     Cutoff (ng/mL) Amphetamine and metabolites    1000 Barbiturate and metabolites    200 Benzodiazepine                 200 Tricyclics and metabolites     300 Opiates and metabolites        300 Cocaine and metabolites        300 THC                            50 Performed at [facility], [address], [place], [place] [address]   Surgical pcr screen     Status: None   Collection Time: 05/04/18  9:35 AM  Result Value Ref Range   MRSA, PCR NEGATIVE NEGATIVE   Staphylococcus aureus NEGATIVE NEGATIVE    Comment: (NOTE) The Xpert SA Assay (FDA approved for NASAL specimens in patients 35 years of age and older), is one component of a comprehensive surveillance program. It is not intended to diagnose infection nor to guide or monitor treatment. Performed at [facility], [address], [place], [place] [address]   Hepatitis panel, acute     Status: None   Collection Time: 05/04/18 12:16 PM  Result Value Ref Range   Hepatitis B Surface Ag Negative Negative   HCV Ab <0.1 0.0 - 0.9 s/co ratio    Comment: (NOTE)                                  Negative:     < 0.8                             Indeterminate: 0.8 - 0.9                                  Positive:     > 0.9 The CDC recommends that a positive HCV antibody result be followed up with a HCV Nucleic Acid Amplification test (884166). Performed At: [facility] [address] [place], [place] [id number] Jolene Schimke MD [id number]    Hep A IgM Negative Negative   Hep B C IgM Negative Negative  HCV Ab Reflex to Quant PCR     Status: None   Collection Time: 05/04/18  1:49 PM  Result Value Ref Range  HCV Ab <0.1 0.0 - 0.9 s/co ratio    Comment: (NOTE) Performed At: [facility] [address] [place], [place] [id number] Jolene Schimke MD [id number]   Interpretation:     Status: None   Collection Time: 05/04/18  1:49 PM  Result Value Ref Range   HCV Interp 1: Comment     Comment: (NOTE) Negative Not infected with HCV, unless recent infection is suspected or other evidence exists to indicate HCV infection. Performed At: [facility] [address] [place], [place] [id number] Jolene Schimke MD [id number]   Comprehensive metabolic panel     Status: Abnormal    Collection Time: 05/05/18  3:29 AM  Result Value Ref Range   Sodium 135 135 - 145 mmol/L   Potassium 4.0 3.5 - 5.1 mmol/L   Chloride 101 98 - 111 mmol/L   CO2 25 22 - 32 mmol/L   Glucose, Bld 107 (H) 70 - 99 mg/dL   BUN 8 6 - 20 mg/dL   Creatinine, Ser 0.10 (H) 0.61 - 1.24 mg/dL   Calcium 6.5 (L) 8.9 - 10.3 mg/dL   Total Protein 5.7 (L) 6.5 - 8.1 g/dL   Albumin 2.5 (L) 3.5 - 5.0 g/dL   AST 99 (H) 15 - 41 U/L   ALT 215 (H) 0 - 44 U/L   Alkaline Phosphatase 179 (H) 38 - 126 U/L   Total Bilirubin 6.6 (H) 0.3 - 1.2 mg/dL   GFR calc non Af Amer >60 >60 mL/min   GFR calc Af Amer >60 >60 mL/min   Anion gap 9 5 - 15    Comment: Performed at [facility], [address], [place], [place] [address]  CBC     Status: Abnormal   Collection Time: 05/05/18  3:29 AM  Result Value Ref Range   WBC 6.9 4.0 - 10.5 K/uL   RBC 4.27 4.22 - 5.81 MIL/uL   Hemoglobin 12.6 (L) 13.0 - 17.0 g/dL   HCT 66.4 (L) 40.3 - 47.4 %   MCV 87.8 80.0 - 100.0 fL   MCH 29.5 26.0 - 34.0 pg   MCHC 33.6 30.0 - 36.0 g/dL   RDW 25.9 56.3 - 87.5 %   Platelets 102 (L) 150 - 400 K/uL    Comment: REPEATED TO VERIFY PLATELET COUNT CONFIRMED BY SMEAR SPECIMEN CHECKED FOR CLOTS Immature Platelet Fraction may be clinically indicated, consider ordering this additional test [id number]    nRBC 0.0 0.0 - 0.2 %    Comment: Performed at [facility], [address], [place], [place] [address]    Studies/Results: Mr 3d Recon At Scanner  Result Date: 05/04/2018 CLINICAL DATA:  Right upper quadrant abdominal pain with dilated liver function studies and cholelithiasis. EXAM: MRI ABDOMEN WITHOUT AND WITH CONTRAST (INCLUDING MRCP) TECHNIQUE: Multiplanar multisequence MR imaging of the abdomen was performed both before and after the administration of intravenous contrast. Heavily T2-weighted images of the biliary and pancreatic ducts were obtained, and three-dimensional MRCP images were rendered by post processing.  CONTRAST:  8 cc Gadavist COMPARISON:  Abdominal ultrasound 05/04/2018.  Chest CT 01/13/2014. FINDINGS: Despite efforts by the technologist and patient, moderate motion artifact is present on today's exam and could not be eliminated. This reduces exam sensitivity and specificity. The thin section MRCP images are essentially nondiagnostic. Lower chest: Trace bilateral pleural effusions. The visualized lung bases are otherwise unremarkable. Hepatobiliary: No significant steatosis or morphologic changes of cirrhosis. On the immediate postcontrast images, there is mildly heterogeneous parenchymal enhancement,  suggesting passive congestion. This is associated with mild periportal edema. No focal lesions are identified on delayed imaging. As on ultrasound, there is a 16 mm gallstone and mild gallbladder wall thickening. There is a small amount of pericholecystic fluid. Although the MRCP images are limited, there is no biliary dilatation or evidence of choledocholithiasis. Pancreas: Unremarkable. No pancreatic ductal dilatation or surrounding inflammatory changes. Spleen: Measures 16.6 x 16.0 x 7.8 cm (volume = 1100 cm^3) consistent with moderate splenomegaly. No focal abnormality. Adrenals/Urinary Tract: Both adrenal glands appear normal. The kidneys and proximal ureters appear normal. No hydronephrosis. Stomach/Bowel: No evidence of bowel wall thickening, distention or surrounding inflammatory change. Vascular/Lymphatic: There are few scattered prominent mesenteric lymph nodes which are likely reactive. No retroperitoneal adenopathy. No significant vascular findings. The portal, superior mesenteric and splenic veins are patent. Other: Mild mesenteric edema and trace perihepatic ascites. No focal extraluminal fluid collection. Musculoskeletal: No acute or significant osseous findings. IMPRESSION: 1. Cholelithiasis with nonspecific mild gallbladder wall thickening and pericholecystic fluid. The gallbladder wall thickening  is likely secondary to adjacent inflammation. 2. No evidence of biliary dilatation or choledocholithiasis. MRCP images are limited. 3. Periportal edema and heterogeneous early hepatic parenchymal enhancement, most consistent with chronic passive congestion or hepatitis. No focal hepatic abnormality. 4. Trace ascites and bilateral pleural effusions. 5. Moderate splenomegaly. Electronically Signed   By: Carey BullocksWilliam  Veazey M.D.   On: 05/04/2018 11:43   [place] Abdomen Limited  Result Date: 05/04/2018 CLINICAL DATA:  Right upper quadrant pain with elevated AST and ALT EXAM: ULTRASOUND ABDOMEN LIMITED RIGHT UPPER QUADRANT COMPARISON:  None. FINDINGS: Gallbladder: A single large gallstone is noted measuring 2.1 cm. Gallbladder wall is thickened to 6 mm without striation or pericholecystic edema. Negative Murphy sign. Common bile duct: Diameter: 5-6 mm. Where visualized, no filling defect. On a few images portal triads are somewhat prominent such that intrahepatic biliary dilatation is a consideration. Liver: No focal lesion identified. Within normal limits in parenchymal echogenicity. Portal vein is patent on color Doppler imaging with normal direction of blood flow towards the liver. IMPRESSION: 1. Cholelithiasis. Equivocal for cholecystitis as there is gallbladder wall thickening without reported Murphy sign or over distension. The thickening could be reactive. 2. Normal common bile duct diameter. 3. Somewhat prominent portal triads which may be a sign of hepatitis or mild intrahepatic biliary dilatation. Given labs, consider MRI. Electronically Signed   By: Marnee SpringJonathon  Watts M.D.   On: 05/04/2018 04:40   Mr Abdomen Mrcp Vivien RossettiW Wo Contast  Result Date: 05/04/2018 CLINICAL DATA:  Right upper quadrant abdominal pain with dilated liver function studies and cholelithiasis. EXAM: MRI ABDOMEN WITHOUT AND WITH CONTRAST (INCLUDING MRCP) TECHNIQUE: Multiplanar multisequence MR imaging of the abdomen was performed both before and after  the administration of intravenous contrast. Heavily T2-weighted images of the biliary and pancreatic ducts were obtained, and three-dimensional MRCP images were rendered by post processing. CONTRAST:  8 cc Gadavist COMPARISON:  Abdominal ultrasound 05/04/2018.  Chest CT 01/13/2014. FINDINGS: Despite efforts by the technologist and patient, moderate motion artifact is present on today's exam and could not be eliminated. This reduces exam sensitivity and specificity. The thin section MRCP images are essentially nondiagnostic. Lower chest: Trace bilateral pleural effusions. The visualized lung bases are otherwise unremarkable. Hepatobiliary: No significant steatosis or morphologic changes of cirrhosis. On the immediate postcontrast images, there is mildly heterogeneous parenchymal enhancement, suggesting passive congestion. This is associated with mild periportal edema. No focal lesions are identified on delayed imaging. As on ultrasound, there is  a 16 mm gallstone and mild gallbladder wall thickening. There is a small amount of pericholecystic fluid. Although the MRCP images are limited, there is no biliary dilatation or evidence of choledocholithiasis. Pancreas: Unremarkable. No pancreatic ductal dilatation or surrounding inflammatory changes. Spleen: Measures 16.6 x 16.0 x 7.8 cm (volume = 1100 cm^3) consistent with moderate splenomegaly. No focal abnormality. Adrenals/Urinary Tract: Both adrenal glands appear normal. The kidneys and proximal ureters appear normal. No hydronephrosis. Stomach/Bowel: No evidence of bowel wall thickening, distention or surrounding inflammatory change. Vascular/Lymphatic: There are few scattered prominent mesenteric lymph nodes which are likely reactive. No retroperitoneal adenopathy. No significant vascular findings. The portal, superior mesenteric and splenic veins are patent. Other: Mild mesenteric edema and trace perihepatic ascites. No focal extraluminal fluid collection.  Musculoskeletal: No acute or significant osseous findings. IMPRESSION: 1. Cholelithiasis with nonspecific mild gallbladder wall thickening and pericholecystic fluid. The gallbladder wall thickening is likely secondary to adjacent inflammation. 2. No evidence of biliary dilatation or choledocholithiasis. MRCP images are limited. 3. Periportal edema and heterogeneous early hepatic parenchymal enhancement, most consistent with chronic passive congestion or hepatitis. No focal hepatic abnormality. 4. Trace ascites and bilateral pleural effusions. 5. Moderate splenomegaly. Electronically Signed   By: Carey Bullocks M.D.   On: 05/04/2018 11:43    Medications: I have reviewed the patient's current medications.  Assessment: Cholelithiasis with gallbladder wall thickening and pericholecystic fluid Periportal edema and heterogeneous early hepatic parenchymal enhancement consistent with chronic passive congestion/hepatitis-no leukocytosis Moderate splenomegaly  Acute hepatitis A, acute hep B panel: negative, HCV antibody negative Mild improvement in LFTs(TB/AST/ALT/ALP from 7.8/165/339/232 to 6.6/99/215/179)  EBV VCA IgM pending Continued on IV Cipro and IV Flagyl, D5 half-normal saline with 20 M EQ KCl at 125 mL/h,IV  morphine and p.o. oxycodone for pain management  Plan: Patient with acute cholecystitis and possible hepatitis?  Viral, EBV serology and HIV serology pending Agree with surgery, will likely need cholecystectomy if clinically unchanged.   Kerin Salen 05/05/2018, 10:15 AM   Pager [phone] If no answer or after 5 PM call [phone]

## 2018-05-05 NOTE — Progress Notes (Signed)
Patient ID: Omar Rodriguez, male   DOB: 1990-08-11, 27 y.o.   MRN: [id number]     Subjective: Feels about the same.  Pain is better with medications.  Low-grade nausea.  Objective: Vital signs in last 24 hours: Temp:  [98.3 F (36.8 C)-100.9 F (38.3 C)] 100.9 F (38.3 C) (03/21 0700) Pulse Rate:  [93-112] 112 (03/21 0700) Resp:  [18-19] 18 (03/21 0700) BP: (97-110)/(48-73) 110/59 (03/21 0700) SpO2:  [93 %-97 %] 94 % (03/21 0700) Last BM Date: 05/02/18  Intake/Output from previous day: 03/20 0701 - 03/21 0700 In: 2356.4 [I.V.:1359.4; IV Piggyback:997] Out: 1225 [Urine:1225] Intake/Output this shift: Total I/O In: 449.7 [I.V.:449.7] Out: -   General appearance: alert, cooperative, mild distress and Jaundiced GI: Very tender in the right abdomen and right upper quadrant, localized  Lab Results:  Recent Labs    05/04/18 0144 05/05/18 0329  WBC 10.2 6.9  HGB 13.8 12.6*  HCT 42.0 37.5*  PLT 135* 102*   BMET Recent Labs    05/04/18 0144 05/05/18 0329  NA 133* 135  K 3.7 4.0  CL 98 101  CO2 25 25  GLUCOSE 94 107*  BUN 7 8  CREATININE 1.36* 1.31*  CALCIUM 6.8* 6.5*   Hepatic Function Latest Ref Rng & Units 05/05/2018 05/04/2018 01/13/2014  Total Protein 6.5 - 8.1 g/dL 5.7(L) 6.5 5.7(L)  Albumin 3.5 - 5.0 g/dL 2.5(L) 3.1(L) 2.4(L)  AST 15 - 41 U/L 99(H) 165(H) 52(H)  ALT 0 - 44 U/L 215(H) 339(H) 39  Alk Phosphatase 38 - 126 U/L 179(H) 232(H) 65  Total Bilirubin 0.3 - 1.2 mg/dL 6.6(H) 7.8(H) 1.2   Hepatitis panel negative  HCV antibody negative  Epstein bar VR S and HIV antibody pending   Studies/Results: Mr 3d Recon At Scanner  Result Date: 05/04/2018 CLINICAL DATA:  Right upper quadrant abdominal pain with dilated liver function studies and cholelithiasis. EXAM: MRI ABDOMEN WITHOUT AND WITH CONTRAST (INCLUDING MRCP) TECHNIQUE: Multiplanar multisequence MR imaging of the abdomen was performed both before and after the administration of intravenous contrast.  Heavily T2-weighted images of the biliary and pancreatic ducts were obtained, and three-dimensional MRCP images were rendered by post processing. CONTRAST:  8 cc Gadavist COMPARISON:  Abdominal ultrasound 05/04/2018.  Chest CT 01/13/2014. FINDINGS: Despite efforts by the technologist and patient, moderate motion artifact is present on today's exam and could not be eliminated. This reduces exam sensitivity and specificity. The thin section MRCP images are essentially nondiagnostic. Lower chest: Trace bilateral pleural effusions. The visualized lung bases are otherwise unremarkable. Hepatobiliary: No significant steatosis or morphologic changes of cirrhosis. On the immediate postcontrast images, there is mildly heterogeneous parenchymal enhancement, suggesting passive congestion. This is associated with mild periportal edema. No focal lesions are identified on delayed imaging. As on ultrasound, there is a 16 mm gallstone and mild gallbladder wall thickening. There is a small amount of pericholecystic fluid. Although the MRCP images are limited, there is no biliary dilatation or evidence of choledocholithiasis. Pancreas: Unremarkable. No pancreatic ductal dilatation or surrounding inflammatory changes. Spleen: Measures 16.6 x 16.0 x 7.8 cm (volume = 1100 cm^3) consistent with moderate splenomegaly. No focal abnormality. Adrenals/Urinary Tract: Both adrenal glands appear normal. The kidneys and proximal ureters appear normal. No hydronephrosis. Stomach/Bowel: No evidence of bowel wall thickening, distention or surrounding inflammatory change. Vascular/Lymphatic: There are few scattered prominent mesenteric lymph nodes which are likely reactive. No retroperitoneal adenopathy. No significant vascular findings. The portal, superior mesenteric and splenic veins are patent. Other: Mild mesenteric  edema and trace perihepatic ascites. No focal extraluminal fluid collection. Musculoskeletal: No acute or significant osseous  findings. IMPRESSION: 1. Cholelithiasis with nonspecific mild gallbladder wall thickening and pericholecystic fluid. The gallbladder wall thickening is likely secondary to adjacent inflammation. 2. No evidence of biliary dilatation or choledocholithiasis. MRCP images are limited. 3. Periportal edema and heterogeneous early hepatic parenchymal enhancement, most consistent with chronic passive congestion or hepatitis. No focal hepatic abnormality. 4. Trace ascites and bilateral pleural effusions. 5. Moderate splenomegaly. Electronically Signed   By: Richardean Sale M.D.   On: 05/04/2018 11:43   US Abdomen Limited  Result Date: 05/04/2018 CLINICAL DATA:  Right upper quadrant pain with elevated AST and ALT EXAM: ULTRASOUND ABDOMEN LIMITED RIGHT UPPER QUADRANT COMPARISON:  None. FINDINGS: Gallbladder: A single large gallstone is noted measuring 2.1 cm. Gallbladder wall is thickened to 6 mm without striation or pericholecystic edema. Negative Murphy sign. Common bile duct: Diameter: 5-6 mm. Where visualized, no filling defect. On a few images portal triads are somewhat prominent such that intrahepatic biliary dilatation is a consideration. Liver: No focal lesion identified. Within normal limits in parenchymal echogenicity. Portal vein is patent on color Doppler imaging with normal direction of blood flow towards the liver. IMPRESSION: 1. Cholelithiasis. Equivocal for cholecystitis as there is gallbladder wall thickening without reported Murphy sign or over distension. The thickening could be reactive. 2. Normal common bile duct diameter. 3. Somewhat prominent portal triads which may be a sign of hepatitis or mild intrahepatic biliary dilatation. Given labs, consider MRI. Electronically Signed   By: Monte Fantasia M.D.   On: 05/04/2018 04:40   Mr Abdomen Mrcp Moise Boring Contast  Result Date: 05/04/2018 CLINICAL DATA:  Right upper quadrant abdominal pain with dilated liver function studies and cholelithiasis. EXAM: MRI  ABDOMEN WITHOUT AND WITH CONTRAST (INCLUDING MRCP) TECHNIQUE: Multiplanar multisequence MR imaging of the abdomen was performed both before and after the administration of intravenous contrast. Heavily T2-weighted images of the biliary and pancreatic ducts were obtained, and three-dimensional MRCP images were rendered by post processing. CONTRAST:  8 cc Gadavist COMPARISON:  Abdominal ultrasound 05/04/2018.  Chest CT 01/13/2014. FINDINGS: Despite efforts by the technologist and patient, moderate motion artifact is present on today's exam and could not be eliminated. This reduces exam sensitivity and specificity. The thin section MRCP images are essentially nondiagnostic. Lower chest: Trace bilateral pleural effusions. The visualized lung bases are otherwise unremarkable. Hepatobiliary: No significant steatosis or morphologic changes of cirrhosis. On the immediate postcontrast images, there is mildly heterogeneous parenchymal enhancement, suggesting passive congestion. This is associated with mild periportal edema. No focal lesions are identified on delayed imaging. As on ultrasound, there is a 16 mm gallstone and mild gallbladder wall thickening. There is a small amount of pericholecystic fluid. Although the MRCP images are limited, there is no biliary dilatation or evidence of choledocholithiasis. Pancreas: Unremarkable. No pancreatic ductal dilatation or surrounding inflammatory changes. Spleen: Measures 16.6 x 16.0 x 7.8 cm (volume = 1100 cm^3) consistent with moderate splenomegaly. No focal abnormality. Adrenals/Urinary Tract: Both adrenal glands appear normal. The kidneys and proximal ureters appear normal. No hydronephrosis. Stomach/Bowel: No evidence of bowel wall thickening, distention or surrounding inflammatory change. Vascular/Lymphatic: There are few scattered prominent mesenteric lymph nodes which are likely reactive. No retroperitoneal adenopathy. No significant vascular findings. The portal, superior  mesenteric and splenic veins are patent. Other: Mild mesenteric edema and trace perihepatic ascites. No focal extraluminal fluid collection. Musculoskeletal: No acute or significant osseous findings. IMPRESSION: 1. Cholelithiasis with nonspecific  mild gallbladder wall thickening and pericholecystic fluid. The gallbladder wall thickening is likely secondary to adjacent inflammation. 2. No evidence of biliary dilatation or choledocholithiasis. MRCP images are limited. 3. Periportal edema and heterogeneous early hepatic parenchymal enhancement, most consistent with chronic passive congestion or hepatitis. No focal hepatic abnormality. 4. Trace ascites and bilateral pleural effusions. 5. Moderate splenomegaly. Electronically Signed   By: Richardean Sale M.D.   On: 05/04/2018 11:43    Anti-infectives: Anti-infectives (From admission, onward)   Start     Dose/Rate Route Frequency Ordered Stop   05/04/18 0929  ciprofloxacin (CIPRO) IVPB 400 mg     400 mg 200 mL/hr over 60 Minutes Intravenous Every 12 hours 05/04/18 0929     05/04/18 0929  metroNIDAZOLE (FLAGYL) IVPB 500 mg     500 mg 100 mL/hr over 60 Minutes Intravenous Every 8 hours 05/04/18 0929     05/04/18 0600  ciprofloxacin (CIPRO) IVPB 400 mg     400 mg 200 mL/hr over 60 Minutes Intravenous  Once 05/04/18 0555 05/04/18 0712   05/04/18 0600  metroNIDAZOLE (FLAGYL) IVPB 500 mg     500 mg 100 mL/hr over 60 Minutes Intravenous  Once 05/04/18 0555 05/04/18 5623      Assessment/Plan: 28 year old male with abdominal pain and nausea, large gallstone.  Elevated LFTs.  MRI more consistent with hepatitis than acute cholecystitis.  CBD normal. Hepatitis panel and HCV antibody negative.  Epstein-Barr and HIV studies pending. Appreciate GI consultation Clinically unchanged.  For now await above studies.  If all other work-up negative will likely require cholecystectomy.    LOS: 0 days    Edward Jolly 05/05/2018

## 2018-05-06 DIAGNOSIS — Z79899 Other long term (current) drug therapy: Secondary | ICD-10-CM | POA: Diagnosis not present

## 2018-05-06 DIAGNOSIS — R748 Abnormal levels of other serum enzymes: Secondary | ICD-10-CM | POA: Diagnosis present

## 2018-05-06 DIAGNOSIS — E871 Hypo-osmolality and hyponatremia: Secondary | ICD-10-CM | POA: Diagnosis present

## 2018-05-06 DIAGNOSIS — Z8701 Personal history of pneumonia (recurrent): Secondary | ICD-10-CM | POA: Diagnosis not present

## 2018-05-06 DIAGNOSIS — N179 Acute kidney failure, unspecified: Secondary | ICD-10-CM | POA: Diagnosis not present

## 2018-05-06 DIAGNOSIS — R1011 Right upper quadrant pain: Secondary | ICD-10-CM | POA: Diagnosis not present

## 2018-05-06 DIAGNOSIS — D6959 Other secondary thrombocytopenia: Secondary | ICD-10-CM | POA: Diagnosis present

## 2018-05-06 DIAGNOSIS — D696 Thrombocytopenia, unspecified: Secondary | ICD-10-CM | POA: Diagnosis not present

## 2018-05-06 DIAGNOSIS — D649 Anemia, unspecified: Secondary | ICD-10-CM | POA: Diagnosis present

## 2018-05-06 DIAGNOSIS — E860 Dehydration: Secondary | ICD-10-CM | POA: Diagnosis present

## 2018-05-06 DIAGNOSIS — F909 Attention-deficit hyperactivity disorder, unspecified type: Secondary | ICD-10-CM | POA: Diagnosis present

## 2018-05-06 DIAGNOSIS — Z881 Allergy status to other antibiotic agents status: Secondary | ICD-10-CM | POA: Diagnosis not present

## 2018-05-06 DIAGNOSIS — R21 Rash and other nonspecific skin eruption: Secondary | ICD-10-CM | POA: Diagnosis present

## 2018-05-06 DIAGNOSIS — R112 Nausea with vomiting, unspecified: Secondary | ICD-10-CM | POA: Diagnosis not present

## 2018-05-06 DIAGNOSIS — B2700 Gammaherpesviral mononucleosis without complication: Secondary | ICD-10-CM | POA: Diagnosis present

## 2018-05-06 DIAGNOSIS — B1710 Acute hepatitis C without hepatic coma: Secondary | ICD-10-CM | POA: Diagnosis present

## 2018-05-06 DIAGNOSIS — Z7952 Long term (current) use of systemic steroids: Secondary | ICD-10-CM | POA: Diagnosis not present

## 2018-05-06 DIAGNOSIS — K819 Cholecystitis, unspecified: Secondary | ICD-10-CM | POA: Diagnosis not present

## 2018-05-06 LAB — COMPREHENSIVE METABOLIC PANEL
ALT: 178 U/L — ABNORMAL HIGH (ref 0–44)
AST: 92 U/L — ABNORMAL HIGH (ref 15–41)
Albumin: 2.5 g/dL — ABNORMAL LOW (ref 3.5–5.0)
Alkaline Phosphatase: 183 U/L — ABNORMAL HIGH (ref 38–126)
Anion gap: 9 (ref 5–15)
BUN: 7 mg/dL (ref 6–20)
CO2: 24 mmol/L (ref 22–32)
Calcium: 6.5 mg/dL — ABNORMAL LOW (ref 8.9–10.3)
Chloride: 100 mmol/L (ref 98–111)
Creatinine, Ser: 1.33 mg/dL — ABNORMAL HIGH (ref 0.61–1.24)
GFR calc non Af Amer: >60 mL/min (ref >60)
Glucose, Bld: 108 mg/dL — ABNORMAL HIGH (ref 70–99)
Potassium: 4.3 mmol/L (ref 3.5–5.1)
Sodium: 133 mmol/L — ABNORMAL LOW (ref 135–145)
Total Bilirubin: 7.1 mg/dL — ABNORMAL HIGH (ref 0.3–1.2)
Total Protein: 5.8 g/dL — ABNORMAL LOW (ref 6.5–8.1)

## 2018-05-06 LAB — CBC
HCT: 37.4 % — ABNORMAL LOW (ref 39.0–52.0)
Hemoglobin: 12.5 g/dL — ABNORMAL LOW (ref 13.0–17.0)
MCH: 29.5 pg (ref 26.0–34.0)
MCHC: 33.4 g/dL (ref 30.0–36.0)
MCV: 88.2 fL (ref 80.0–100.0)
Platelets: 104 10*3/uL — ABNORMAL LOW (ref 150–400)
RBC: 4.24 MIL/uL (ref 4.22–5.81)
RDW: 13.8 % (ref 11.5–15.5)
WBC: 9.8 10*3/uL (ref 4.0–10.5)
nRBC: 0 % (ref 0.0–0.2)

## 2018-05-06 NOTE — Progress Notes (Signed)
Patient ID: Omar Rodriguez, male   DOB: November 07, 1990, 28 y.o.   MRN: [id number]     Subjective: He feels better today.  Less pain.  Tolerated clear liquids without nausea yesterday.  Objective: Vital signs in last 24 hours: Temp:  [97.5 F (36.4 C)-99 F (37.2 C)] 97.6 F (36.4 C) (03/22 0507) Pulse Rate:  [82-104] 82 (03/22 0507) Resp:  [15-20] 16 (03/22 0507) BP: (99-104)/(59-69) 103/67 (03/22 0507) SpO2:  [96 %-100 %] 97 % (03/22 0507) Last BM Date: 05/02/18  Intake/Output from previous day: 03/21 0701 - 03/22 0700 In: 3666.9 [P.O.:232; I.V.:2734.9; IV Piggyback:700] Out: 1350 [Urine:1350] Intake/Output this shift: Total I/O In: 620.2 [I.V.:486.5; IV Piggyback:133.7] Out: -   General appearance: alert, cooperative and no distress GI: Mild right upper quadrant tenderness without guarding, improved from yesterday  Lab Results:  Recent Labs    05/05/18 0329 05/06/18 0242  WBC 6.9 9.8  HGB 12.6* 12.5*  HCT 37.5* 37.4*  PLT 102* 104*   BMET Recent Labs    05/05/18 0329 05/06/18 0242  NA 135 133*  K 4.0 4.3  CL 101 100  CO2 25 24  GLUCOSE 107* 108*  BUN 8 7  CREATININE 1.31* 1.33*  CALCIUM 6.5* 6.5*   Hepatic Function Latest Ref Rng & Units 05/06/2018 05/05/2018 05/04/2018  Total Protein 6.5 - 8.1 g/dL 5.8(L) 5.7(L) 6.5  Albumin 3.5 - 5.0 g/dL 2.5(L) 2.5(L) 3.1(L)  AST 15 - 41 U/L 92(H) 99(H) 165(H)  ALT 0 - 44 U/L 178(H) 215(H) 339(H)  Alk Phosphatase 38 - 126 U/L 183(H) 179(H) 232(H)  Total Bilirubin 0.3 - 1.2 mg/dL 7.1(H) 6.6(H) 7.8(H)    Studies/Results: Mr 3d Recon At Scanner  Result Date: 05/04/2018 CLINICAL DATA:  Right upper quadrant abdominal pain with dilated liver function studies and cholelithiasis. EXAM: MRI ABDOMEN WITHOUT AND WITH CONTRAST (INCLUDING MRCP) TECHNIQUE: Multiplanar multisequence MR imaging of the abdomen was performed both before and after the administration of intravenous contrast. Heavily T2-weighted images of the biliary and  pancreatic ducts were obtained, and three-dimensional MRCP images were rendered by post processing. CONTRAST:  8 cc Gadavist COMPARISON:  Abdominal ultrasound 05/04/2018.  Chest CT 01/13/2014. FINDINGS: Despite efforts by the technologist and patient, moderate motion artifact is present on today's exam and could not be eliminated. This reduces exam sensitivity and specificity. The thin section MRCP images are essentially nondiagnostic. Lower chest: Trace bilateral pleural effusions. The visualized lung bases are otherwise unremarkable. Hepatobiliary: No significant steatosis or morphologic changes of cirrhosis. On the immediate postcontrast images, there is mildly heterogeneous parenchymal enhancement, suggesting passive congestion. This is associated with mild periportal edema. No focal lesions are identified on delayed imaging. As on ultrasound, there is a 16 mm gallstone and mild gallbladder wall thickening. There is a small amount of pericholecystic fluid. Although the MRCP images are limited, there is no biliary dilatation or evidence of choledocholithiasis. Pancreas: Unremarkable. No pancreatic ductal dilatation or surrounding inflammatory changes. Spleen: Measures 16.6 x 16.0 x 7.8 cm (volume = 1100 cm^3) consistent with moderate splenomegaly. No focal abnormality. Adrenals/Urinary Tract: Both adrenal glands appear normal. The kidneys and proximal ureters appear normal. No hydronephrosis. Stomach/Bowel: No evidence of bowel wall thickening, distention or surrounding inflammatory change. Vascular/Lymphatic: There are few scattered prominent mesenteric lymph nodes which are likely reactive. No retroperitoneal adenopathy. No significant vascular findings. The portal, superior mesenteric and splenic veins are patent. Other: Mild mesenteric edema and trace perihepatic ascites. No focal extraluminal fluid collection. Musculoskeletal: No acute or significant  osseous findings. IMPRESSION: 1. Cholelithiasis with  nonspecific mild gallbladder wall thickening and pericholecystic fluid. The gallbladder wall thickening is likely secondary to adjacent inflammation. 2. No evidence of biliary dilatation or choledocholithiasis. MRCP images are limited. 3. Periportal edema and heterogeneous early hepatic parenchymal enhancement, most consistent with chronic passive congestion or hepatitis. No focal hepatic abnormality. 4. Trace ascites and bilateral pleural effusions. 5. Moderate splenomegaly. Electronically Signed   By: Richardean Sale M.D.   On: 05/04/2018 11:43   Mr Abdomen Mrcp Moise Boring Contast  Result Date: 05/04/2018 CLINICAL DATA:  Right upper quadrant abdominal pain with dilated liver function studies and cholelithiasis. EXAM: MRI ABDOMEN WITHOUT AND WITH CONTRAST (INCLUDING MRCP) TECHNIQUE: Multiplanar multisequence MR imaging of the abdomen was performed both before and after the administration of intravenous contrast. Heavily T2-weighted images of the biliary and pancreatic ducts were obtained, and three-dimensional MRCP images were rendered by post processing. CONTRAST:  8 cc Gadavist COMPARISON:  Abdominal ultrasound 05/04/2018.  Chest CT 01/13/2014. FINDINGS: Despite efforts by the technologist and patient, moderate motion artifact is present on today's exam and could not be eliminated. This reduces exam sensitivity and specificity. The thin section MRCP images are essentially nondiagnostic. Lower chest: Trace bilateral pleural effusions. The visualized lung bases are otherwise unremarkable. Hepatobiliary: No significant steatosis or morphologic changes of cirrhosis. On the immediate postcontrast images, there is mildly heterogeneous parenchymal enhancement, suggesting passive congestion. This is associated with mild periportal edema. No focal lesions are identified on delayed imaging. As on ultrasound, there is a 16 mm gallstone and mild gallbladder wall thickening. There is a small amount of pericholecystic fluid.  Although the MRCP images are limited, there is no biliary dilatation or evidence of choledocholithiasis. Pancreas: Unremarkable. No pancreatic ductal dilatation or surrounding inflammatory changes. Spleen: Measures 16.6 x 16.0 x 7.8 cm (volume = 1100 cm^3) consistent with moderate splenomegaly. No focal abnormality. Adrenals/Urinary Tract: Both adrenal glands appear normal. The kidneys and proximal ureters appear normal. No hydronephrosis. Stomach/Bowel: No evidence of bowel wall thickening, distention or surrounding inflammatory change. Vascular/Lymphatic: There are few scattered prominent mesenteric lymph nodes which are likely reactive. No retroperitoneal adenopathy. No significant vascular findings. The portal, superior mesenteric and splenic veins are patent. Other: Mild mesenteric edema and trace perihepatic ascites. No focal extraluminal fluid collection. Musculoskeletal: No acute or significant osseous findings. IMPRESSION: 1. Cholelithiasis with nonspecific mild gallbladder wall thickening and pericholecystic fluid. The gallbladder wall thickening is likely secondary to adjacent inflammation. 2. No evidence of biliary dilatation or choledocholithiasis. MRCP images are limited. 3. Periportal edema and heterogeneous early hepatic parenchymal enhancement, most consistent with chronic passive congestion or hepatitis. No focal hepatic abnormality. 4. Trace ascites and bilateral pleural effusions. 5. Moderate splenomegaly. Electronically Signed   By: Richardean Sale M.D.   On: 05/04/2018 11:43    Anti-infectives: Anti-infectives (From admission, onward)   Start     Dose/Rate Route Frequency Ordered Stop   05/04/18 0929  ciprofloxacin (CIPRO) IVPB 400 mg     400 mg 200 mL/hr over 60 Minutes Intravenous Every 12 hours 05/04/18 0929     05/04/18 0929  metroNIDAZOLE (FLAGYL) IVPB 500 mg     500 mg 100 mL/hr over 60 Minutes Intravenous Every 8 hours 05/04/18 0929     05/04/18 0600  ciprofloxacin (CIPRO)  IVPB 400 mg     400 mg 200 mL/hr over 60 Minutes Intravenous  Once 05/04/18 0555 05/04/18 0712   05/04/18 0600  metroNIDAZOLE (FLAGYL) IVPB 500 mg  500 mg 100 mL/hr over 60 Minutes Intravenous  Once 05/04/18 0555 05/04/18 7001      Assessment/Plan: 28 year old male with abdominal pain and nausea, large gallstone.  Elevated LFTs.  MRI more consistent with hepatitis than acute cholecystitis.  CBD normal. Hepatitis panel and HCV antibody negative.  Epstein-Barr and HIV studies pending. Appreciate GI consultation He is clinically improved today with less pain and tenderness although LFTs are a little worse.  Clinically unusual for acute cholecystitis. For now await above studies.  If all other work-up negative will likely require cholecystectomy     LOS: 0 days    Edward Jolly 05/06/2018

## 2018-05-06 NOTE — Progress Notes (Signed)
Subjective: Patient was seen and examined at bedside. Complains of continued but improvement in upper abdominal pain.  On clear liquid diet.  Objective: Vital signs in last 24 hours: Temp:  [97.5 F (36.4 C)-99 F (37.2 C)] 97.6 F (36.4 C) (03/22 0507) Pulse Rate:  [82-104] 82 (03/22 0507) Resp:  [15-20] 16 (03/22 0507) BP: (99-104)/(59-69) 103/67 (03/22 0507) SpO2:  [96 %-100 %] 97 % (03/22 0507) Weight change:  Last BM Date: 05/02/18  PE: Not in distress GENERAL: Prominent icterus, no pallor ABDOMEN: Nondistended EXTREMITIES: no Deformity  Lab Results: Results for orders placed or performed during the hospital encounter of 05/04/18 (from the past 48 hour(s))  Hepatitis panel, acute     Status: None   Collection Time: 05/04/18 12:16 PM  Result Value Ref Range   Hepatitis B Surface Ag Negative Negative   HCV Ab <0.1 0.0 - 0.9 s/co ratio    Comment: (NOTE)                                  Negative:     < 0.8                             Indeterminate: 0.8 - 0.9                                  Positive:     > 0.9 The CDC recommends that a positive HCV antibody result be followed up with a HCV Nucleic Acid Amplification test (811914). Performed At: [facility] [address] [place], [place] [id number] Jolene Schimke MD [id number]    Hep A IgM Negative Negative   Hep B C IgM Negative Negative  HCV Ab Reflex to Quant PCR     Status: None   Collection Time: 05/04/18  1:49 PM  Result Value Ref Range   HCV Ab <0.1 0.0 - 0.9 s/co ratio    Comment: (NOTE) Performed At: [facility] [address] [place], [place] [id number] Jolene Schimke MD [id number]   Epstein-Barr virus VCA, IgM     Status: Abnormal   Collection Time: 05/04/18  1:49 PM  Result Value Ref Range   EBV VCA IgM >160.0 (H) 0.0 - 35.9 U/mL    Comment: (NOTE)                                 Negative        <36.0                                 Equivocal 36.0 - 43.9                Positive        >43.9 Performed At: [facility] [address] [place], [place] [id number] Jolene Schimke MD [id number]   Interpretation:     Status: None   Collection Time: 05/04/18  1:49 PM  Result Value Ref Range   HCV Interp 1: Comment     Comment: (NOTE) Negative Not infected with HCV, unless recent infection is suspected or other evidence exists to indicate HCV infection. Performed At: [facility] [address] [place], [place] [id number] Jolene Schimke  MD [id number]   HIV antibody (Routine Testing)     Status: None   Collection Time: 05/05/18  3:29 AM  Result Value Ref Range   HIV Screen 4th Generation wRfx Non Reactive Non Reactive    Comment: (NOTE) Performed At: [facility] [address] [place], [place] [id number] Jolene Schimke MD [id number]   Comprehensive metabolic panel     Status: Abnormal   Collection Time: 05/05/18  3:29 AM  Result Value Ref Range   Sodium 135 135 - 145 mmol/L   Potassium 4.0 3.5 - 5.1 mmol/L   Chloride 101 98 - 111 mmol/L   CO2 25 22 - 32 mmol/L   Glucose, Bld 107 (H) 70 - 99 mg/dL   BUN 8 6 - 20 mg/dL   Creatinine, Ser 7.35 (H) 0.61 - 1.24 mg/dL   Calcium 6.5 (L) 8.9 - 10.3 mg/dL   Total Protein 5.7 (L) 6.5 - 8.1 g/dL   Albumin 2.5 (L) 3.5 - 5.0 g/dL   AST 99 (H) 15 - 41 U/L   ALT 215 (H) 0 - 44 U/L   Alkaline Phosphatase 179 (H) 38 - 126 U/L   Total Bilirubin 6.6 (H) 0.3 - 1.2 mg/dL   GFR calc non Af Amer >60 >60 mL/min   GFR calc Af Amer >60 >60 mL/min   Anion gap 9 5 - 15    Comment: Performed at [facility], [address], [place], [place] [address]  CBC     Status: Abnormal   Collection Time: 05/05/18  3:29 AM  Result Value Ref Range   WBC 6.9 4.0 - 10.5 K/uL   RBC 4.27 4.22 - 5.81 MIL/uL   Hemoglobin 12.6 (L) 13.0 - 17.0 g/dL   HCT 47.8 (L) 41.2 - 82.0 %   MCV 87.8 80.0 - 100.0 fL   MCH 29.5 26.0 - 34.0 pg   MCHC 33.6 30.0 - 36.0 g/dL   RDW 81.3 88.7 - 19.5 %    Platelets 102 (L) 150 - 400 K/uL    Comment: REPEATED TO VERIFY PLATELET COUNT CONFIRMED BY SMEAR SPECIMEN CHECKED FOR CLOTS Immature Platelet Fraction may be clinically indicated, consider ordering this additional test [id number]    nRBC 0.0 0.0 - 0.2 %    Comment: Performed at [facility], [address] [place], [place] [address]  CBC     Status: Abnormal   Collection Time: 05/06/18  2:42 AM  Result Value Ref Range   WBC 9.8 4.0 - 10.5 K/uL   RBC 4.24 4.22 - 5.81 MIL/uL   Hemoglobin 12.5 (L) 13.0 - 17.0 g/dL   HCT 82.5 (L) 74.9 - 35.5 %   MCV 88.2 80.0 - 100.0 fL   MCH 29.5 26.0 - 34.0 pg   MCHC 33.4 30.0 - 36.0 g/dL   RDW 21.7 47.1 - 59.5 %   Platelets 104 (L) 150 - 400 K/uL    Comment: REPEATED TO VERIFY Immature Platelet Fraction may be clinically indicated, consider ordering this additional test [id number] CONSISTENT WITH PREVIOUS RESULT    nRBC 0.0 0.0 - 0.2 %    Comment: Performed at [facility], [address], [place], [place] [address]  Comprehensive metabolic panel     Status: Abnormal   Collection Time: 05/06/18  2:42 AM  Result Value Ref Range   Sodium 133 (L) 135 - 145 mmol/L   Potassium 4.3 3.5 - 5.1 mmol/L   Chloride 100 98 - 111 mmol/L  CO2 24 22 - 32 mmol/L   Glucose, Bld 108 (H) 70 - 99 mg/dL   BUN 7 6 - 20 mg/dL   Creatinine, Ser 1.61 (H) 0.61 - 1.24 mg/dL   Calcium 6.5 (L) 8.9 - 10.3 mg/dL   Total Protein 5.8 (L) 6.5 - 8.1 g/dL   Albumin 2.5 (L) 3.5 - 5.0 g/dL   AST 92 (H) 15 - 41 U/L   ALT 178 (H) 0 - 44 U/L   Alkaline Phosphatase 183 (H) 38 - 126 U/L   Total Bilirubin 7.1 (H) 0.3 - 1.2 mg/dL   GFR calc non Af Amer >60 >60 mL/min   GFR calc Af Amer >60 >60 mL/min   Anion gap 9 5 - 15    Comment: Performed at [facility], [address], [place], [place] [address]    Studies/Results: Mr 3d Recon At Scanner  Result Date: 05/04/2018 CLINICAL DATA:  Right upper quadrant abdominal pain with dilated liver function  studies and cholelithiasis. EXAM: MRI ABDOMEN WITHOUT AND WITH CONTRAST (INCLUDING MRCP) TECHNIQUE: Multiplanar multisequence MR imaging of the abdomen was performed both before and after the administration of intravenous contrast. Heavily T2-weighted images of the biliary and pancreatic ducts were obtained, and three-dimensional MRCP images were rendered by post processing. CONTRAST:  8 cc Gadavist COMPARISON:  Abdominal ultrasound 05/04/2018.  Chest CT 01/13/2014. FINDINGS: Despite efforts by the technologist and patient, moderate motion artifact is present on today's exam and could not be eliminated. This reduces exam sensitivity and specificity. The thin section MRCP images are essentially nondiagnostic. Lower chest: Trace bilateral pleural effusions. The visualized lung bases are otherwise unremarkable. Hepatobiliary: No significant steatosis or morphologic changes of cirrhosis. On the immediate postcontrast images, there is mildly heterogeneous parenchymal enhancement, suggesting passive congestion. This is associated with mild periportal edema. No focal lesions are identified on delayed imaging. As on ultrasound, there is a 16 mm gallstone and mild gallbladder wall thickening. There is a small amount of pericholecystic fluid. Although the MRCP images are limited, there is no biliary dilatation or evidence of choledocholithiasis. Pancreas: Unremarkable. No pancreatic ductal dilatation or surrounding inflammatory changes. Spleen: Measures 16.6 x 16.0 x 7.8 cm (volume = 1100 cm^3) consistent with moderate splenomegaly. No focal abnormality. Adrenals/Urinary Tract: Both adrenal glands appear normal. The kidneys and proximal ureters appear normal. No hydronephrosis. Stomach/Bowel: No evidence of bowel wall thickening, distention or surrounding inflammatory change. Vascular/Lymphatic: There are few scattered prominent mesenteric lymph nodes which are likely reactive. No retroperitoneal adenopathy. No significant  vascular findings. The portal, superior mesenteric and splenic veins are patent. Other: Mild mesenteric edema and trace perihepatic ascites. No focal extraluminal fluid collection. Musculoskeletal: No acute or significant osseous findings. IMPRESSION: 1. Cholelithiasis with nonspecific mild gallbladder wall thickening and pericholecystic fluid. The gallbladder wall thickening is likely secondary to adjacent inflammation. 2. No evidence of biliary dilatation or choledocholithiasis. MRCP images are limited. 3. Periportal edema and heterogeneous early hepatic parenchymal enhancement, most consistent with chronic passive congestion or hepatitis. No focal hepatic abnormality. 4. Trace ascites and bilateral pleural effusions. 5. Moderate splenomegaly. Electronically Signed   By: Carey Bullocks M.D.   On: 05/04/2018 11:43   Mr Abdomen Mrcp Vivien Rossetti Contast  Result Date: 05/04/2018 CLINICAL DATA:  Right upper quadrant abdominal pain with dilated liver function studies and cholelithiasis. EXAM: MRI ABDOMEN WITHOUT AND WITH CONTRAST (INCLUDING MRCP) TECHNIQUE: Multiplanar multisequence MR imaging of the abdomen was performed both before and after the administration of intravenous contrast. Heavily  T2-weighted images of the biliary and pancreatic ducts were obtained, and three-dimensional MRCP images were rendered by post processing. CONTRAST:  8 cc Gadavist COMPARISON:  Abdominal ultrasound 05/04/2018.  Chest CT 01/13/2014. FINDINGS: Despite efforts by the technologist and patient, moderate motion artifact is present on today's exam and could not be eliminated. This reduces exam sensitivity and specificity. The thin section MRCP images are essentially nondiagnostic. Lower chest: Trace bilateral pleural effusions. The visualized lung bases are otherwise unremarkable. Hepatobiliary: No significant steatosis or morphologic changes of cirrhosis. On the immediate postcontrast images, there is mildly heterogeneous parenchymal  enhancement, suggesting passive congestion. This is associated with mild periportal edema. No focal lesions are identified on delayed imaging. As on ultrasound, there is a 16 mm gallstone and mild gallbladder wall thickening. There is a small amount of pericholecystic fluid. Although the MRCP images are limited, there is no biliary dilatation or evidence of choledocholithiasis. Pancreas: Unremarkable. No pancreatic ductal dilatation or surrounding inflammatory changes. Spleen: Measures 16.6 x 16.0 x 7.8 cm (volume = 1100 cm^3) consistent with moderate splenomegaly. No focal abnormality. Adrenals/Urinary Tract: Both adrenal glands appear normal. The kidneys and proximal ureters appear normal. No hydronephrosis. Stomach/Bowel: No evidence of bowel wall thickening, distention or surrounding inflammatory change. Vascular/Lymphatic: There are few scattered prominent mesenteric lymph nodes which are likely reactive. No retroperitoneal adenopathy. No significant vascular findings. The portal, superior mesenteric and splenic veins are patent. Other: Mild mesenteric edema and trace perihepatic ascites. No focal extraluminal fluid collection. Musculoskeletal: No acute or significant osseous findings. IMPRESSION: 1. Cholelithiasis with nonspecific mild gallbladder wall thickening and pericholecystic fluid. The gallbladder wall thickening is likely secondary to adjacent inflammation. 2. No evidence of biliary dilatation or choledocholithiasis. MRCP images are limited. 3. Periportal edema and heterogeneous early hepatic parenchymal enhancement, most consistent with chronic passive congestion or hepatitis. No focal hepatic abnormality. 4. Trace ascites and bilateral pleural effusions. 5. Moderate splenomegaly. Electronically Signed   By: Carey Bullocks M.D.   On: 05/04/2018 11:43    Medications: I have reviewed the patient's current medications.  Assessment: EBV VCA IgM positive, likley contributing to elevated liver  enzymes and periportal edema noted on imaging, moderate splenomegaly TB/AST/ALT/ALP of 7.1/92/178/183  Otherwise, negative Hepatitis A, Hepatitis B, Hepatitis C, HIV  Cholelithiasis with mild GB wall thickening and pericholecystic fluid  Plan: The mainstay of treatment for individuals with infectious mononucleosis is supportive therapy, currently on D5 half-normal saline with 20 M EQ KCl at 125 mL/h.  There is no evidence of worsening liver enzymes, or hemodynamic instability T-max was 100.9 F on 05/05/2018 at 7 AM, patient has been afebrile since then. Patient currently on IV Cipro and IV Flagyl for suspected cholecystitis, timing of cholecystectomy to be decided by surgery.     Kerin Salen 05/06/2018, 10:16 AM   Pager [phone] If no answer or after 5 PM call [phone]

## 2018-05-07 ENCOUNTER — Encounter (HOSPITAL_COMMUNITY): Payer: Self-pay | Admitting: Internal Medicine

## 2018-05-07 DIAGNOSIS — B2700 Gammaherpesviral mononucleosis without complication: Secondary | ICD-10-CM

## 2018-05-07 DIAGNOSIS — N179 Acute kidney failure, unspecified: Secondary | ICD-10-CM

## 2018-05-07 DIAGNOSIS — K819 Cholecystitis, unspecified: Secondary | ICD-10-CM | POA: Insufficient documentation

## 2018-05-07 DIAGNOSIS — R112 Nausea with vomiting, unspecified: Secondary | ICD-10-CM

## 2018-05-07 HISTORY — DX: Gammaherpesviral mononucleosis without complication: B27.00

## 2018-05-07 HISTORY — DX: Cholecystitis, unspecified: K81.9

## 2018-05-07 LAB — CBC
HCT: 38.5 % — ABNORMAL LOW (ref 39.0–52.0)
Hemoglobin: 12.3 g/dL — ABNORMAL LOW (ref 13.0–17.0)
MCH: 28.3 pg (ref 26.0–34.0)
MCHC: 31.9 g/dL (ref 30.0–36.0)
MCV: 88.5 fL (ref 80.0–100.0)
Platelets: 103 10*3/uL — ABNORMAL LOW (ref 150–400)
RBC: 4.35 MIL/uL (ref 4.22–5.81)
RDW: 13.7 % (ref 11.5–15.5)
WBC: 11.1 10*3/uL — AB (ref 4.0–10.5)
nRBC: 0 % (ref 0.0–0.2)

## 2018-05-07 LAB — HEPATIC FUNCTION PANEL
ALT: 160 U/L — AB (ref 0–44)
AST: 110 U/L — ABNORMAL HIGH (ref 15–41)
Albumin: 2.5 g/dL — ABNORMAL LOW (ref 3.5–5.0)
Alkaline Phosphatase: 187 U/L — ABNORMAL HIGH (ref 38–126)
Bilirubin, Direct: 4.4 mg/dL — ABNORMAL HIGH (ref 0.0–0.2)
Indirect Bilirubin: 3 mg/dL — ABNORMAL HIGH (ref 0.3–0.9)
Total Bilirubin: 7.4 mg/dL — ABNORMAL HIGH (ref 0.3–1.2)
Total Protein: 6 g/dL — ABNORMAL LOW (ref 6.5–8.1)

## 2018-05-07 MED ORDER — DOCUSATE SODIUM 100 MG PO CAPS
100.0000 mg | ORAL_CAPSULE | Freq: Two times a day (BID) | ORAL | Status: DC
  Administered 2018-05-07 – 2018-05-11 (×8): 100 mg via ORAL
  Filled 2018-05-07 (×9): qty 1

## 2018-05-07 MED ORDER — POLYETHYLENE GLYCOL 3350 17 G PO PACK: 17.0000 g | PACK | Freq: Every day | ORAL | Status: DC | PRN

## 2018-05-07 NOTE — Progress Notes (Signed)
[facility] Progress Note  Omar Rodriguez 27 y.o. Jan 16, 1991   Subjective: Complaining of RUQ pain. Nausea with no appetite. Tolerating clears. Family at bedside.  Objective: Vital signs: Vitals:   05/06/18 2056 05/07/18 0543  BP: 113/71 109/61  Pulse: 95 97  Resp: 18 18  Temp: 98.2 F (36.8 C) 97.7 F (36.5 C)  SpO2: 96% 99%    Physical Exam: Gen: lethargic, well-nourished, no acute distress  HEENT: anicteric sclera CV: RRR Chest: CTA B Abd: RUQ and epigastric tenderness with guarding, soft, nondistended, +BS Ext: no edema  Lab Results: Recent Labs    05/05/18 0329 05/06/18 0242  NA 135 133*  K 4.0 4.3  CL 101 100  CO2 25 24  GLUCOSE 107* 108*  BUN 8 7  CREATININE 1.31* 1.33*  CALCIUM 6.5* 6.5*   Recent Labs    05/06/18 0242 05/07/18 0200  AST 92* 110*  ALT 178* 160*  ALKPHOS 183* 187*  BILITOT 7.1* 7.4*  PROT 5.8* 6.0*  ALBUMIN 2.5* 2.5*   Recent Labs    05/06/18 0242 05/07/18 0200  WBC 9.8 11.1*  HGB 12.5* 12.3*  HCT 37.4* 38.5*  MCV 88.2 88.5  PLT 104* 103*      Assessment/Plan: Infectious mononucleosis which I think is the main source of his elevated liver enzymes. Slight increase in LFTs today compared with yesterday. Follow LFTs. Agree with stopping IV Abx. Doubt cholecysitis and agree with surgery that no surgery needed at this time. After resolution of the mono if he has evidence of biliary colic then will need to f/u with surgery. Continue IVFs. Clear liquids. Supportive care.    Shirley Friar 05/07/2018, 10:51 AM  Questions please call [phone] ID: Omar Rodriguez, male   DOB: 1990/08/24, 28 y.o.   MRN: [id number]

## 2018-05-07 NOTE — Progress Notes (Addendum)
Subjective: CC: Abdominal Pain Patients abdominal pain continues to improve. Some nausea after pain medication but denies any otherwise. Tolerated a CLD yesterday, currently NPO. Passing flatus. No BM since Friday.   Objective: Vital signs in last 24 hours: Temp:  [97.7 F (36.5 C)-98.2 F (36.8 C)] 97.7 F (36.5 C) (03/23 0543) Pulse Rate:  [92-97] 97 (03/23 0543) Resp:  [15-18] 18 (03/23 0543) BP: (93-113)/(54-71) 109/61 (03/23 0543) SpO2:  [96 %-99 %] 99 % (03/23 0543) Last BM Date: 05/02/18  Intake/Output from previous day: 03/22 0701 - 03/23 0700 In: 3401.2 [P.O.:240; I.V.:2481.3; IV Piggyback:679.9] Out: 850 [Urine:850] Intake/Output this shift: No intake/output data recorded.  PE: Gen: Awake and alert Heart: RRR Lungs: CTA b/l   Lab Results:  Recent Labs    05/06/18 0242 05/07/18 0200  WBC 9.8 11.1*  HGB 12.5* 12.3*  HCT 37.4* 38.5*  PLT 104* 103*   BMET Recent Labs    05/05/18 0329 05/06/18 0242  NA 135 133*  K 4.0 4.3  CL 101 100  CO2 25 24  GLUCOSE 107* 108*  BUN 8 7  CREATININE 1.31* 1.33*  CALCIUM 6.5* 6.5*   PT/INR No results for input(s): LABPROT, INR in the last 72 hours. CMP     Component Value Date/Time   NA 133 (L) 05/06/2018 0242   K 4.3 05/06/2018 0242   CL 100 05/06/2018 0242   CO2 24 05/06/2018 0242   GLUCOSE 108 (H) 05/06/2018 0242   BUN 7 05/06/2018 0242   CREATININE 1.33 (H) 05/06/2018 0242   CALCIUM 6.5 (L) 05/06/2018 0242   PROT 6.0 (L) 05/07/2018 0200   ALBUMIN 2.5 (L) 05/07/2018 0200   AST 110 (H) 05/07/2018 0200   ALT 160 (H) 05/07/2018 0200   ALKPHOS 187 (H) 05/07/2018 0200   BILITOT 7.4 (H) 05/07/2018 0200   GFRNONAA >60 05/06/2018 0242   GFRAA >60 05/06/2018 0242   Lipase     Component Value Date/Time   LIPASE 50 05/04/2018 0144       Studies/Results: No results found.  Anti-infectives: Anti-infectives (From admission, onward)   Start     Dose/Rate Route Frequency Ordered Stop   05/04/18 0929  ciprofloxacin (CIPRO) IVPB 400 mg     400 mg 200 mL/hr over 60 Minutes Intravenous Every 12 hours 05/04/18 0929     05/04/18 0929  metroNIDAZOLE (FLAGYL) IVPB 500 mg     500 mg 100 mL/hr over 60 Minutes Intravenous Every 8 hours 05/04/18 0929     05/04/18 0600  ciprofloxacin (CIPRO) IVPB 400 mg     400 mg 200 mL/hr over 60 Minutes Intravenous  Once 05/04/18 0555 05/04/18 0712   05/04/18 0600  metroNIDAZOLE (FLAGYL) IVPB 500 mg     500 mg 100 mL/hr over 60 Minutes Intravenous  Once 05/04/18 0555 05/04/18 0713       Assessment/Plan Abdominal Pain with elevated bilirubin - Patient with abdominal pain and nausea, large gallstone on admission. T bili 7.8. Elevated LFTs.  -MRCP more consistent with hepatitis than acute cholecystitis. CBD normal. There is noted periportal edema and splenomegaly.  - Hepatitis panel and HCV antibody negative.  - Epstein-Barr Virus IgM positive.  Appreciate GI consultation - Likely EBV is the cause of the patients pain and lab abnormalities. Do not suspect acute cholecystitis. His MRCP was negative for this. He will not require cholecystectomy during admission. - Will d/c IV abx and place on CLD - Discussed with Dr. Valentina Lucks of [facility]  Splenomegaly - Patient reports that he plays hockey. Gave instructions to avoid contact sports/activities for the next 2-3 months.    FEN - CLD, D5 1/2 NS, Bowel regimen VTE - SCDs, okay for chemical prophylaxis from surgical standpoint  ID - Cipro/Flagyl 3/20 - 3/23    LOS: 1 day    Jacinto Halim , [facility] Surgery 05/07/2018, 9:49 AM Pager: [phone]

## 2018-05-07 NOTE — Consult Note (Signed)
Medical Consultation   KOLLEN ARMENTI  ZOX:[id number]  DOB: Feb 01, 1991  DOA: 05/04/2018  PCP: Patient, No Pcp Per    Requesting physician: Glenna Fellows, MD  Reason for consultation: EBV infection   History of Present Illness:    Omar Rodriguez is an 28 y.o.  WM PMHx ADHD, cholelithiasis, pneumonia.  Presented to the Emergency Department complaining of gradual, persistent, progressively worsening epigastric and right upper quadrant abdominal pain onset 4 days ago.  He reports he has been seen in the emergency department in [place] each day for the last 4 days with worsening symptoms.  He reports worsening pain, persistent vomiting and worsening lab work.  He reports that yesterday the hospital told him that there was something wrong with his liver but he was unclear what this meant.  He is unable to provide baseline labs from that visit and I do not see any within the epic chart.  Reports his pain is 7/10.  He reports he vomits anytime he attempts to eat or drink anything.  He has been unable to keep down his pain medications.  He denies headache, neck pain, chest pain, shortness of breath, weakness, dizziness, syncope, dysuria, hematuria.        Review of Systems:  Review of Systems  Constitutional: Negative.   HENT: Negative.   Eyes: Negative.   Respiratory: Negative.   Cardiovascular: Negative.   Gastrointestinal: Positive for abdominal pain, nausea and vomiting.  Genitourinary: Negative.   Musculoskeletal: Negative.   Skin: Negative.   Neurological: Positive for weakness.  Psychiatric/Behavioral: Positive for depression.     Past Medical History: Past Medical History:  Diagnosis Date  . Acute Malachi Carl virus (EBV) infection 05/07/2018  . ADHD (attention deficit hyperactivity disorder)   . Gallbladder disease   . Pneumonia     Past Surgical History: Past Surgical History:  Procedure Laterality Date  . 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 Ceclor [Cefaclor] Hives     Social History:  reports that he has never smoked. He has never used smokeless tobacco. He reports that he does not drink alcohol or use drugs.   Family History: Family History  Problem Relation Age of Onset  . Diabetes Father   . Hypertension Father     Consults: GI: Berna Bue, MD General surgery:Brahmbhatt, Parag, MD    Procedures/Significant Events:  3/20 MRCP:-Findings do not seem consistent with simple acute cholecystitis although it is feasible the LFTs reflect compression/ mirizzi; the splenomegaly and periportal edema/ liver parenchymal abnormalities are concerning      I have personally reviewed and interpreted all radiology studies and my findings are as above.  VENTILATOR SETTINGS: None   Cultures 3/20 acute hepatitis panel negative 3/20 EBV panel positive    Antimicrobials: Anti-infectives (From admission, onward)   Start     Ordered Stop   05/04/18 0929  ciprofloxacin (CIPRO) IVPB 400 mg  Status:  Discontinued     05/04/18 0929 05/07/18 0954   05/04/18 0929  metroNIDAZOLE (FLAGYL) IVPB 500 mg  Status:  Discontinued     05/04/18 0929 05/07/18 0954   05/04/18 0600  ciprofloxacin (CIPRO) IVPB 400 mg     05/04/18 0555 05/04/18 0712   05/04/18 0600  metroNIDAZOLE (FLAGYL) IVPB 500 mg     05/04/18 0555 05/04/18 8119  Devices    LINES / TUBES:      Continuous Infusions: . dextrose 5 % and 0.45 % NaCl with KCl 20 mEq/L 125 mL/hr at 05/07/18 0958  . sodium chloride       Physical Exam: Vitals:   05/06/18 0507 05/06/18 1400 05/06/18 2056 05/07/18 0543  BP: 103/67 (!) 93/54 113/71 109/61  Pulse: 82 92 95 97  Resp: 16 15 18 18   Temp: 97.6 F (36.4 C) 98.1 F (36.7 C) 98.2 F (36.8 C) 97.7 F (36.5 C)  TempSrc: Oral Oral Oral Oral  SpO2: 97% 96% 96% 99%  Weight:      Height:        General: A/O x4, no acute respiratory  distress Eyes: negative scleral hemorrhage, negative anisocoria, negative icterus ENT: Negative Runny nose, negative gingival bleeding, Neck:  Negative scars, masses, torticollis, lymphadenopathy, JVD Lungs: Clear to auscultation bilaterally without wheezes or crackles Cardiovascular: Regular rate and rhythm without murmur gallop or rub normal S1 and S2 Abdomen: Positive abdominal pain, Positive distention, proactive soft, bowel sounds, no rebound, no ascites, no appreciable mass Extremities: No significant cyanosis, clubbing, or edema bilateral lower extremities Skin: Negative rashes, lesions, ulcers Psychiatric:  Negative depression, negative anxiety, negative fatigue, negative mania  Central nervous system:  Cranial nerves II through XII intact, tongue/uvula midline, all extremities muscle strength 5/5, sensation intact throughout, negative dysarthria, negative expressive aphasia, negative receptive aphasia.  Data reviewed:  I have personally reviewed following labs and imaging studies Labs:  CBC: Recent Labs  Lab 05/04/18 0144 05/05/18 0329 05/06/18 0242 05/07/18 0200  WBC 10.2 6.9 9.8 11.1*  HGB 13.8 12.6* 12.5* 12.3*  HCT 42.0 37.5* 37.4* 38.5*  MCV 87.5 87.8 88.2 88.5  PLT 135* 102* 104* 103*    Basic Metabolic Panel: Recent Labs  Lab 05/04/18 0144 05/05/18 0329 05/06/18 0242  NA 133* 135 133*  K 3.7 4.0 4.3  CL 98 101 100  CO2 25 25 24   GLUCOSE 94 107* 108*  BUN 7 8 7   CREATININE 1.36* 1.31* 1.33*  CALCIUM 6.8* 6.5* 6.5*   GFR Estimated Creatinine Clearance: 84 mL/min (A) (by C-G formula based on SCr of 1.33 mg/dL (H)). Liver Function Tests: Recent Labs  Lab 05/04/18 0144 05/05/18 0329 05/06/18 0242 05/07/18 0200  AST 165* 99* 92* 110*  ALT 339* 215* 178* 160*  ALKPHOS 232* 179* 183* 187*  BILITOT 7.8* 6.6* 7.1* 7.4*  PROT 6.5 5.7* 5.8* 6.0*  ALBUMIN 3.1* 2.5* 2.5* 2.5*   Recent Labs  Lab 05/04/18 0144  LIPASE 50   No results for input(s):  AMMONIA in the last 168 hours. Coagulation profile No results for input(s): INR, PROTIME in the last 168 hours.  Cardiac Enzymes: No results for input(s): CKTOTAL, CKMB, CKMBINDEX, TROPONINI in the last 168 hours. BNP: Invalid input(s): POCBNP CBG: No results for input(s): GLUCAP in the last 168 hours. D-Dimer No results for input(s): DDIMER in the last 72 hours. Hgb A1c No results for input(s): HGBA1C in the last 72 hours. Lipid Profile No results for input(s): CHOL, HDL, LDLCALC, TRIG, CHOLHDL, LDLDIRECT in the last 72 hours. Thyroid function studies No results for input(s): TSH, T4TOTAL, T3FREE, THYROIDAB in the last 72 hours.  Invalid input(s): FREET3 Anemia work up No results for input(s): VITAMINB12, FOLATE, FERRITIN, TIBC, IRON, RETICCTPCT in the last 72 hours. Urinalysis    Component Value Date/Time   COLORURINE AMBER (A) 05/04/2018 0145   APPEARANCEUR CLOUDY (A) 05/04/2018 0145   LABSPEC 1.031 (H) 05/04/2018  0145   PHURINE 5.0 05/04/2018 0145   GLUCOSEU NEGATIVE 05/04/2018 0145   HGBUR MODERATE (A) 05/04/2018 0145   BILIRUBINUR MODERATE (A) 05/04/2018 0145   KETONESUR NEGATIVE 05/04/2018 0145   PROTEINUR 100 (A) 05/04/2018 0145   UROBILINOGEN 2.0 (H) 01/15/2014 1321   NITRITE NEGATIVE 05/04/2018 0145   LEUKOCYTESUR NEGATIVE 05/04/2018 0145     Microbiology Recent Results (from the past 240 hour(s))  Surgical pcr screen     Status: None   Collection Time: 05/04/18  9:35 AM  Result Value Ref Range Status   MRSA, PCR NEGATIVE NEGATIVE Final   Staphylococcus aureus NEGATIVE NEGATIVE Final    Comment: (NOTE) The Xpert SA Assay (FDA approved for NASAL specimens in patients 52 years of age and older), is one component of a comprehensive surveillance program. It is not intended to diagnose infection nor to guide or monitor treatment. Performed at [facility], [address], [place], [place] [address]        Inpatient Medications:   Scheduled Meds:  . docusate sodium  100 mg Oral BID  . pantoprazole (PROTONIX) IV  40 mg Intravenous QHS  . sodium chloride flush  3 mL Intravenous Once   Continuous Infusions: . dextrose 5 % and 0.45 % NaCl with KCl 20 mEq/L 125 mL/hr at 05/07/18 0958  . sodium chloride       Radiological Exams on Admission: No results found.  Impression/Recommendations Active Problems:   Calculous cholecystitis   Acute Epstein Barr virus (EBV) infection   EBV infection/EBV Hepatitis -Treatment for EBV hepatitis is supportive care -Continue IV fluids: D5 W-0.45% saline plus KCl 20 mEq 12ml/hr - Feed as tolerated - Monitor liver enzymes  A calculus cholecystitis - No surgery contemplated at this time. - Antibiotics discontinued per surgery  Acute renal failure -Elevated creatinine monitor closely Recent Labs  Lab 05/04/18 0144 05/05/18 0329 05/06/18 0242  CREATININE 1.36* 1.31* 1.33*      Thank you for this consultation.  Our [facility] hospitalist team will follow the patient with you.   Time Spent: 40 minutes  , Roselind Messier M.D. [facility] 05/07/2018, 10:41 AM

## 2018-05-08 DIAGNOSIS — B2700 Gammaherpesviral mononucleosis without complication: Secondary | ICD-10-CM

## 2018-05-08 DIAGNOSIS — N179 Acute kidney failure, unspecified: Secondary | ICD-10-CM

## 2018-05-08 LAB — COMPREHENSIVE METABOLIC PANEL
ALT: 143 U/L — ABNORMAL HIGH (ref 0–44)
ANION GAP: 9 (ref 5–15)
AST: 110 U/L — ABNORMAL HIGH (ref 15–41)
Albumin: 2.6 g/dL — ABNORMAL LOW (ref 3.5–5.0)
Alkaline Phosphatase: 202 U/L — ABNORMAL HIGH (ref 38–126)
BUN: 6 mg/dL (ref 6–20)
CO2: 24 mmol/L (ref 22–32)
Calcium: 7 mg/dL — ABNORMAL LOW (ref 8.9–10.3)
Chloride: 97 mmol/L — ABNORMAL LOW (ref 98–111)
Creatinine, Ser: 1.2 mg/dL (ref 0.61–1.24)
GFR calc non Af Amer: >60 mL/min (ref >60)
Glucose, Bld: 95 mg/dL (ref 70–99)
Potassium: 4.7 mmol/L (ref 3.5–5.1)
Sodium: 130 mmol/L — ABNORMAL LOW (ref 135–145)
Total Bilirubin: 8.4 mg/dL — ABNORMAL HIGH (ref 0.3–1.2)
Total Protein: 6 g/dL — ABNORMAL LOW (ref 6.5–8.1)

## 2018-05-08 LAB — MAGNESIUM: Magnesium: 2 mg/dL (ref 1.7–2.4)

## 2018-05-08 LAB — PROTIME-INR
INR: 1.2 (ref 0.8–1.2)
Prothrombin Time: 14.8 seconds (ref 11.4–15.2)

## 2018-05-08 LAB — EPSTEIN BARR VRS(EBV DNA BY PCR)
EBV DNA QN by PCR: 11871 copies/mL
log10 EBV DNA Qn PCR: 4.074 log10 copy/mL

## 2018-05-08 LAB — APTT: aPTT: 38 seconds — ABNORMAL HIGH (ref 24–36)

## 2018-05-08 NOTE — TOC Initial Note (Signed)
Transition of Care [facility]) - Initial/Assessment Note    Patient Details  Name: Omar Rodriguez MRN: [id number] Date of Birth: 12/12/1990  [facility] CM/SW Contact:    Kingsley Plan, RN Phone Number: 05/08/2018, 11:30 AM  Clinical Narrative:                  Patient from home with father [place] Steffy Rodriguez.   PCP Is Wallie Renshaw NP [phone] Expected Discharge Plan: Home/Self Care Barriers to Discharge: Continued Medical Work up   Patient Goals and CMS Choice        Expected Discharge Plan and Services Expected Discharge Plan: Home/Self Care   Discharge Planning Services: CM Consult Post Acute Care Choice: NA Living arrangements for the past 2 months: Single Family Home                 DME Arranged: N/A        Prior Living Arrangements/Services Living arrangements for the past 2 months: Single Family Home Lives with:: Parents(lives with father Omar Rodriguez ) Patient language and need for interpreter reviewed:: No        Need for Family Participation in Patient Care: Yes (Comment)     Criminal Activity/Legal Involvement Pertinent to Current Situation/Hospitalization: No - Comment as needed  Activities of Daily Living Home Assistive Devices/Equipment: None ADL Screening (condition at time of admission) Patient's cognitive ability adequate to safely complete daily activities?: Yes Is the patient deaf or have difficulty hearing?: No Does the patient have difficulty seeing, even when wearing glasses/contacts?: No Does the patient have difficulty concentrating, remembering, or making decisions?: No Patient able to express need for assistance with ADLs?: No Does the patient have difficulty dressing or bathing?: No Independently performs ADLs?: Yes (appropriate for developmental age) Does the patient have difficulty walking or climbing stairs?: No Weakness of Legs: None Weakness of Arms/Hands: None  Permission Sought/Granted Permission sought to  share information with : Case Manager, Family Supports Permission granted to share information with : Yes, Verbal Permission Granted  Share Information with NAME: Aryn Haffey Rodriguez      Permission granted to share info w Relationship: father     Emotional Assessment              Admission diagnosis:  Cholecystitis [K81.9] Gallstones and inflammation of gallbladder with obstruction [K80.19] AKI (acute kidney injury) (HCC) [N17.9] Right upper quadrant abdominal pain [R10.11] Intractable vomiting with nausea, unspecified vomiting type [R11.2] Patient Active Problem List   Diagnosis Date Noted  . Acute Malachi Carl virus (EBV) infection 05/07/2018  . Acalculous cholecystitis 05/07/2018  . Calculous cholecystitis 05/04/2018  . Acute respiratory failure (HCC)   . Sepsis (HCC) 01/13/2014  . Acute respiratory failure with hypoxia (HCC) 01/12/2014  . Acute renal insufficiency 01/12/2014  . Dehydration 01/12/2014  . CAP (community acquired pneumonia) 01/12/2014  . UTI (lower urinary tract infection) 01/12/2014    Class: Question of   PCP:  Patient, No Pcp Per Pharmacy:   [address] [place], [place] - [address] [place] [address] Phone: [phone] Fax: [phone]     Social Determinants of Health (SDOH) Interventions    Readmission Risk Interventions No flowsheet data found.

## 2018-05-08 NOTE — Progress Notes (Signed)
Subjective: CC: Abdominal Pain Patients abdominal pain continues to improve. Some nausea after pain medication but denies any otherwise. Tolerating liquids without n/v. Passing flatus.   Objective: Vital signs in last 24 hours: Temp:  [97.4 F (36.3 C)-99.1 F (37.3 C)] 98.7 F (37.1 C) (03/24 0453) Pulse Rate:  [87-102] 99 (03/24 0453) Resp:  [16-18] 16 (03/24 0453) BP: (107-115)/(61-70) 115/70 (03/24 0453) SpO2:  [92 %-96 %] 92 % (03/24 0453) Last BM Date: 05/07/18  Intake/Output from previous day: 03/23 0701 - 03/24 0700 In: 3740.1 [P.O.:360; I.V.:3380.1] Out: 650 [Urine:650] Intake/Output this shift: Total I/O In: -  Out: 450 [Urine:450]  PE: Gen: Awake and alert Heart: RRR Lungs: CTA b/l   Lab Results:  Recent Labs    05/06/18 0242 05/07/18 0200  WBC 9.8 11.1*  HGB 12.5* 12.3*  HCT 37.4* 38.5*  PLT 104* 103*   BMET Recent Labs    05/06/18 0242 05/08/18 0145  NA 133* 130*  K 4.3 4.7  CL 100 97*  CO2 24 24  GLUCOSE 108* 95  BUN 7 6  CREATININE 1.33* 1.20  CALCIUM 6.5* 7.0*   PT/INR Recent Labs    05/08/18 0752  LABPROT 14.8  INR 1.2   CMP     Component Value Date/Time   NA 130 (L) 05/08/2018 0145   K 4.7 05/08/2018 0145   CL 97 (L) 05/08/2018 0145   CO2 24 05/08/2018 0145   GLUCOSE 95 05/08/2018 0145   BUN 6 05/08/2018 0145   CREATININE 1.20 05/08/2018 0145   CALCIUM 7.0 (L) 05/08/2018 0145   PROT 6.0 (L) 05/08/2018 0145   ALBUMIN 2.6 (L) 05/08/2018 0145   AST 110 (H) 05/08/2018 0145   ALT 143 (H) 05/08/2018 0145   ALKPHOS 202 (H) 05/08/2018 0145   BILITOT 8.4 (H) 05/08/2018 0145   GFRNONAA >60 05/08/2018 0145   GFRAA >60 05/08/2018 0145   Lipase     Component Value Date/Time   LIPASE 50 05/04/2018 0144       Studies/Results: No results found.  Anti-infectives: Anti-infectives (From admission, onward)   Start     Dose/Rate Route Frequency Ordered Stop   05/04/18 0929  ciprofloxacin (CIPRO) IVPB 400 mg   Status:  Discontinued     400 mg 200 mL/hr over 60 Minutes Intravenous Every 12 hours 05/04/18 0929 05/07/18 0954   05/04/18 0929  metroNIDAZOLE (FLAGYL) IVPB 500 mg  Status:  Discontinued     500 mg 100 mL/hr over 60 Minutes Intravenous Every 8 hours 05/04/18 0929 05/07/18 0954   05/04/18 0600  ciprofloxacin (CIPRO) IVPB 400 mg     400 mg 200 mL/hr over 60 Minutes Intravenous  Once 05/04/18 0555 05/04/18 0712   05/04/18 0600  metroNIDAZOLE (FLAGYL) IVPB 500 mg     500 mg 100 mL/hr over 60 Minutes Intravenous  Once 05/04/18 0555 05/04/18 0713       Assessment/Plan Abdominal Pain with elevated bilirubin - Patient with abdominal pain and nausea, large gallstone on admission. T bili continues to rise Elevated LFTs.  -MRCP more consistent with hepatitis than acute cholecystitis. CBD normal. There is noted periportal edema and splenomegaly.  - Hepatitis panel and HCV antibody negative.  - Epstein-Barr Virus IgM positive.  Appreciate GI consultation - Likely EBV is the cause of the patients pain and lab abnormalities. Do not suspect acute cholecystitis. His MRCP was negative for this. He will not require cholecystectomy during admission. - Diet as tolerated from our standpoint -  Medicine following/managing EBV  Splenomegaly - Patient reports that he plays hockey. Gave instructions to avoid contact sports/activities for the next 2-3 months.    FEN - diet as tolerated, Bowel regimen VTE - SCDs, okay for chemical prophylaxis from surgical standpoint  ID - Cipro/Flagyl 3/20 - 3/23    LOS: 2 days   Stephanie Coup. Cliffton Asters, M.D. [facility]

## 2018-05-08 NOTE — Final Consult Note (Signed)
Consultant Final Sign-Off Note    Assessment/Final recommendations  Omar Rodriguez is a 28 y.o. male followed by me for elevated LFTs and hyperbilirubinemia. Patient found to have EBV hepatitis during workup. Unlikely that he has cholecystitis and inflammatory changes of gallbladder felt to be secondary to hepatitis.   No surgery planned at this time. I will make sure patient has our contact info for future follow up if needed.   Wound care (if applicable): None   Diet at discharge: per primary team   Activity at discharge: per primary team   Follow-up appointment:  PRN   Pending results:  Unresulted Labs (From admission, onward)    Start     Ordered   05/04/18 1245  Epstein barr vrs(ebv dna by pcr)  Once,   R     05/04/18 1244           Medication recommendations: None    Other recommendations: None     Thank you for allowing [place] to participate in the care of your patient!  Please consult [place] again if you have further needs for your patient.  Tresa Endo [place] 05/08/2018 9:44 AM    Subjective   Patient feeling better this AM. Tolerated CLD. Denies nausea or vomiting. Abdominal pain improving.   Objective  Vital signs in last 24 hours: Temp:  [97.4 F (36.3 C)-99.1 F (37.3 C)] 98.7 F (37.1 C) (03/24 0453) Pulse Rate:  [87-102] 99 (03/24 0453) Resp:  [16-18] 16 (03/24 0453) BP: (107-115)/(61-70) 115/70 (03/24 0453) SpO2:  [92 %-96 %] 92 % (03/24 0453)  General: alert, NAD Resp: normal effort GI: soft, non-tender, mild distention, +BS Skin: no rash    Pertinent labs and Studies: Recent Labs    05/06/18 0242 05/07/18 0200  WBC 9.8 11.1*  HGB 12.5* 12.3*  HCT 37.4* 38.5*   BMET Recent Labs    05/06/18 0242 05/08/18 0145  NA 133* 130*  K 4.3 4.7  CL 100 97*  CO2 24 24  GLUCOSE 108* 95  BUN 7 6  CREATININE 1.33* 1.20  CALCIUM 6.5* 7.0*   No results for input(s): LABURIN in the last 72 hours. Results for orders placed or performed during the  hospital encounter of 05/04/18  Surgical pcr screen     Status: None   Collection Time: 05/04/18  9:35 AM  Result Value Ref Range Status   MRSA, PCR NEGATIVE NEGATIVE Final   Staphylococcus aureus NEGATIVE NEGATIVE Final    Comment: (NOTE) The Xpert SA Assay (FDA approved for NASAL specimens in patients 31 years of age and older), is one component of a comprehensive surveillance program. It is not intended to diagnose infection nor to guide or monitor treatment. Performed at [facility], [address] [place], [place] [address]     Imaging: No results found.

## 2018-05-08 NOTE — Progress Notes (Signed)
PROGRESS NOTE  Omar Rodriguez VPX:[id number] DOB: 12-Mar-1990 DOA: 05/04/2018 PCP: Patient, No Pcp Per  Brief History: Omar Rodriguez is an 28 y.o.  WM PMHx ADHD, cholelithiasis, pneumonia.  Presented to the Emergency Department complaining of gradual, persistent, progressively worsening epigastric and right upper quadrant abdominal pain onset 4 days ago. He reports he has been seen in the emergency department in [place] each day for the last 4 days with worsening symptoms. He reports worsening pain, persistent vomiting and worsening lab work. He reports that yesterday the hospital told him that there was something wrong with his liver but he was unclear what this meant. He is unable to provide baseline labs from that visit and I do not see any within the epic chart. Reports his pain is 7/10. He reports he vomits anytime he attempts to eat or drink anything. He has been unable to keep down his pain medications. He denies headache, neck pain, chest pain, shortness of breath, weakness, dizziness, syncope, dysuria, hematuria.   He is admitted to general surgery service due to concerning for cholecystitis, MRCP more consistent with hepatitis than acute cholecystitis. CBD normal. Likely EBV is the cause of the patients pain and lab abnormalities. Do not suspect acute cholecystitis. He will not require cholecystectomyduring admission.  Patient's care is transferred to hospitalist service on 3/24 Gi continue following    HPI/Recap of past 24 hours:  Feeling better, denies pain, wants to eat more Father at bedside  Assessment/Plan: Active Problems:   Calculous cholecystitis   Acute Epstein Barr virus (EBV) infection   Acalculous cholecystitis  Abdominal Pain with elevated bilirubin (presenting symptoms) - Patient with abdominal pain and nausea, large gallstone on admission. T bili continues to rise Elevated LFTs.  -MRCP more consistent with hepatitis than acute cholecystitis.  CBD normal. There is noted periportal edema and splenomegaly.  - Hepatitis panel and HCV antibody negative.  - Epstein-Barr Virus IgM positive.  Appreciate GI consultation - Likely EBV is the cause of the patients pain and lab abnormalities. Do not suspect acute cholecystitis. His MRCP was negative for this. He will not require cholecystectomyduring admission. -he was on cipro/f  Splenomegaly - Patient reports that he plays hockey. Gave instructions to avoid contact sports/activities for the next 2-3 months.  Acute hepatitis from EBV (Mononucleosis)  Supportive care GI input appreciated  AKI: Cr 1.36 on presentation, cr today is 1.2 Continue hydration   Code Status: full  Family Communication: patient and his father at bedside  Disposition Plan: home in 1-2 days, pending clinical improvement and gi clearance   Consultants:  General surgery  GI  Procedures:  none  Antibiotics:  Cipro/flagyl from 3/20- 3/23   Objective: BP 115/71   Pulse 94   Temp 98.2 F (36.8 C) (Oral)   Resp 16   Ht 5\' 6"  (1.676 m)   Wt 83.9 kg   SpO2 95%   BMI 29.86 kg/m   Intake/Output Summary (Last 24 hours) at 05/08/2018 1256 Last data filed at 05/08/2018 0946 Gross per 24 hour  Intake 3740.07 ml  Output 1100 ml  Net 2640.07 ml   Filed Weights   05/04/18 0113 05/04/18 0849  Weight: 88.5 kg 83.9 kg    Exam: Patient is examined daily including today on 05/08/2018, exams remain the same as of yesterday except that has changed    General:  NAD  Cardiovascular: RRR  Respiratory: CTABL  Abdomen: Soft/ND/NT, positive BS  Musculoskeletal: No Edema  Neuro: alert, oriented  Data Reviewed: Basic Metabolic Panel: Recent Labs  Lab 05/04/18 0144 05/05/18 0329 05/06/18 0242 05/08/18 0145  NA 133* 135 133* 130*  K 3.7 4.0 4.3 4.7  CL 98 101 100 97*  CO2 25 25 24 24   GLUCOSE 94 107* 108* 95  BUN 7 8 7 6   CREATININE 1.36* 1.31* 1.33* 1.20  CALCIUM 6.8* 6.5* 6.5* 7.0*   MG  --   --   --  2.0   Liver Function Tests: Recent Labs  Lab 05/04/18 0144 05/05/18 0329 05/06/18 0242 05/07/18 0200 05/08/18 0145  AST 165* 99* 92* 110* 110*  ALT 339* 215* 178* 160* 143*  ALKPHOS 232* 179* 183* 187* 202*  BILITOT 7.8* 6.6* 7.1* 7.4* 8.4*  PROT 6.5 5.7* 5.8* 6.0* 6.0*  ALBUMIN 3.1* 2.5* 2.5* 2.5* 2.6*   Recent Labs  Lab 05/04/18 0144  LIPASE 50   No results for input(s): AMMONIA in the last 168 hours. CBC: Recent Labs  Lab 05/04/18 0144 05/05/18 0329 05/06/18 0242 05/07/18 0200  WBC 10.2 6.9 9.8 11.1*  HGB 13.8 12.6* 12.5* 12.3*  HCT 42.0 37.5* 37.4* 38.5*  MCV 87.5 87.8 88.2 88.5  PLT 135* 102* 104* 103*   Cardiac Enzymes:   No results for input(s): CKTOTAL, CKMB, CKMBINDEX, TROPONINI in the last 168 hours. BNP (last 3 results) No results for input(s): BNP in the last 8760 hours.  ProBNP (last 3 results) No results for input(s): PROBNP in the last 8760 hours.  CBG: No results for input(s): GLUCAP in the last 168 hours.  Recent Results (from the past 240 hour(s))  Surgical pcr screen     Status: None   Collection Time: 05/04/18  9:35 AM  Result Value Ref Range Status   MRSA, PCR NEGATIVE NEGATIVE Final   Staphylococcus aureus NEGATIVE NEGATIVE Final    Comment: (NOTE) The Xpert SA Assay (FDA approved for NASAL specimens in patients 39 years of age and older), is one component of a comprehensive surveillance program. It is not intended to diagnose infection nor to guide or monitor treatment. Performed at [facility], [address] [place], [place] [address]      Studies: No results found.  Scheduled Meds: . docusate sodium  100 mg Oral BID  . pantoprazole (PROTONIX) IV  40 mg Intravenous QHS  . sodium chloride flush  3 mL Intravenous Once    Continuous Infusions: . dextrose 5 % and 0.45 % NaCl with KCl 20 mEq/L 125 mL/hr at 05/08/18 0944  . sodium chloride       Time spent: I have personally reviewed  and interpreted on  05/08/2018 daily labs, imagings as discussed above under date review session and assessment and plans.  I reviewed all nursing notes, pharmacy notes, consultant notes,  vitals, pertinent old records  I have discussed plan of care as described above with RN , patient and family on 05/08/2018   Albertine Grates MD, PhD  [facility] Pager [phone]. If 7PM-7AM, please contact night-coverage at [email or web address], password [email or web address] 05/08/2018, 12:56 PM  LOS: 2 days

## 2018-05-08 NOTE — Progress Notes (Signed)
[facility] Gastroenterology Progress Note  Omar Rodriguez 28 y.o. [birth year]   Subjective: Hungry this morning. Tolerating clears. Sleeping soundly during my arrival. When woken up complaining of abdominal pain and father says he was sleep when staff came around with meds.  Objective: Vital signs: Vitals:   05/07/18 2153 05/08/18 0453  BP: 107/63 115/70  Pulse: (!) 102 99  Resp: 18 16  Temp: 99.1 F (37.3 C) 98.7 F (37.1 C)  SpO2: 95% 92%    Physical Exam: Gen: lethargic, no acute distress  HEENT: +icteric sclera CV: RRR Chest: CTA B Abd: RUQ and epigastric tenderness with guarding, soft, nondistended, +BS Ext: no edema Skin: petechial rash on torso  Lab Results: Recent Labs    05/06/18 0242 05/08/18 0145  NA 133* 130*  K 4.3 4.7  CL 100 97*  CO2 24 24  GLUCOSE 108* 95  BUN 7 6  CREATININE 1.33* 1.20  CALCIUM 6.5* 7.0*  MG  --  2.0   Recent Labs    05/07/18 0200 05/08/18 0145  AST 110* 110*  ALT 160* 143*  ALKPHOS 187* 202*  BILITOT 7.4* 8.4*  PROT 6.0* 6.0*  ALBUMIN 2.5* 2.6*   Recent Labs    05/06/18 0242 05/07/18 0200  WBC 9.8 11.1*  HGB 12.5* 12.3*  HCT 37.4* 38.5*  MCV 88.2 88.5  PLT 104* 103*      Assessment/Plan: Acute hepatitis from EBV (Mononucleosis) and unlikely to be from his gallbladder. Slight increase in his TB and ALP and slight decrease in ALT. Good sign that he has an appetite today so will slowly advance diet. Full liquid diet today and advance further tomorrow if he tolerates it. Would continue as an inpt for another 1-2 days until he shows signs he can tolerate solid food without worsened abd pain or N/V. Will f/u.   Omar Rodriguez 05/08/2018, 11:34 AM  Questions please call [phone] ID: Omar Rodriguez, male   DOB: 20-Aug-1990, 28 y.o.   MRN: [id number]

## 2018-05-09 DIAGNOSIS — R1011 Right upper quadrant pain: Secondary | ICD-10-CM

## 2018-05-09 DIAGNOSIS — F909 Attention-deficit hyperactivity disorder, unspecified type: Secondary | ICD-10-CM | POA: Diagnosis present

## 2018-05-09 DIAGNOSIS — K8020 Calculus of gallbladder without cholecystitis without obstruction: Secondary | ICD-10-CM | POA: Diagnosis present

## 2018-05-09 DIAGNOSIS — K759 Inflammatory liver disease, unspecified: Secondary | ICD-10-CM | POA: Diagnosis present

## 2018-05-09 DIAGNOSIS — D696 Thrombocytopenia, unspecified: Secondary | ICD-10-CM

## 2018-05-09 DIAGNOSIS — R21 Rash and other nonspecific skin eruption: Secondary | ICD-10-CM

## 2018-05-09 DIAGNOSIS — B2700 Gammaherpesviral mononucleosis without complication: Secondary | ICD-10-CM

## 2018-05-09 DIAGNOSIS — R161 Splenomegaly, not elsewhere classified: Secondary | ICD-10-CM | POA: Diagnosis present

## 2018-05-09 DIAGNOSIS — D649 Anemia, unspecified: Secondary | ICD-10-CM

## 2018-05-09 DIAGNOSIS — Z888 Allergy status to other drugs, medicaments and biological substances status: Secondary | ICD-10-CM

## 2018-05-09 DIAGNOSIS — K0889 Other specified disorders of teeth and supporting structures: Secondary | ICD-10-CM

## 2018-05-09 LAB — COMPREHENSIVE METABOLIC PANEL
ALT: 119 U/L — AB (ref 0–44)
AST: 95 U/L — ABNORMAL HIGH (ref 15–41)
Albumin: 2.4 g/dL — ABNORMAL LOW (ref 3.5–5.0)
Alkaline Phosphatase: 198 U/L — ABNORMAL HIGH (ref 38–126)
Anion gap: 6 (ref 5–15)
BUN: <5 mg/dL — ABNORMAL LOW (ref 6–20)
CO2: 24 mmol/L (ref 22–32)
CREATININE: 1.21 mg/dL (ref 0.61–1.24)
Calcium: 6.9 mg/dL — ABNORMAL LOW (ref 8.9–10.3)
Chloride: 100 mmol/L (ref 98–111)
GFR calc Af Amer: >60 mL/min (ref >60)
GFR calc non Af Amer: >60 mL/min (ref >60)
Glucose, Bld: 98 mg/dL (ref 70–99)
Potassium: 4.7 mmol/L (ref 3.5–5.1)
Sodium: 130 mmol/L — ABNORMAL LOW (ref 135–145)
Total Bilirubin: 8.2 mg/dL — ABNORMAL HIGH (ref 0.3–1.2)
Total Protein: 5.7 g/dL — ABNORMAL LOW (ref 6.5–8.1)

## 2018-05-09 LAB — CBC WITH DIFFERENTIAL/PLATELET
Abs Immature Granulocytes: 0 10*3/uL (ref 0.00–0.07)
Basophils Absolute: 0.1 10*3/uL (ref 0.0–0.1)
Basophils Relative: 1 %
Eosinophils Absolute: 0.1 10*3/uL (ref 0.0–0.5)
Eosinophils Relative: 1 %
HCT: 38.2 % — ABNORMAL LOW (ref 39.0–52.0)
Hemoglobin: 12.2 g/dL — ABNORMAL LOW (ref 13.0–17.0)
LYMPHS PCT: 64 %
Lymphs Abs: 5.9 10*3/uL — ABNORMAL HIGH (ref 0.7–4.0)
MCH: 27.6 pg (ref 26.0–34.0)
MCHC: 31.9 g/dL (ref 30.0–36.0)
MCV: 86.4 fL (ref 80.0–100.0)
Monocytes Absolute: 0.6 10*3/uL (ref 0.1–1.0)
Monocytes Relative: 7 %
Neutro Abs: 2.5 10*3/uL (ref 1.7–7.7)
Neutrophils Relative %: 27 %
Platelets: 93 10*3/uL — ABNORMAL LOW (ref 150–400)
RBC: 4.42 MIL/uL (ref 4.22–5.81)
RDW: 13.6 % (ref 11.5–15.5)
WBC Morphology: ABNORMAL
WBC: 9.2 10*3/uL (ref 4.0–10.5)
nRBC: 0 % (ref 0.0–0.2)

## 2018-05-09 MED ORDER — SODIUM CHLORIDE 0.9 % IV SOLN
INTRAVENOUS | Status: DC
  Administered 2018-05-09 – 2018-05-11 (×4): via INTRAVENOUS

## 2018-05-09 MED ORDER — TRIAMCINOLONE 0.1 % CREAM:EUCERIN CREAM 1:1
TOPICAL_CREAM | Freq: Three times a day (TID) | CUTANEOUS | Status: DC
  Administered 2018-05-09 (×2): via TOPICAL
  Administered 2018-05-10: 1 via TOPICAL
  Administered 2018-05-10 – 2018-05-11 (×3): via TOPICAL
  Filled 2018-05-09: qty 1

## 2018-05-09 MED ORDER — DIPHENHYDRAMINE HCL 25 MG PO CAPS
25.0000 mg | ORAL_CAPSULE | Freq: Two times a day (BID) | ORAL | Status: DC
  Administered 2018-05-09: 25 mg via ORAL
  Filled 2018-05-09: qty 1

## 2018-05-09 MED ORDER — FAMOTIDINE 20 MG IN NS 100 ML IVPB
20.0000 mg | Freq: Two times a day (BID) | INTRAVENOUS | Status: DC
  Administered 2018-05-09 – 2018-05-11 (×5): 20 mg via INTRAVENOUS
  Filled 2018-05-09 (×5): qty 100

## 2018-05-09 MED ORDER — DIPHENHYDRAMINE HCL 25 MG PO CAPS
25.0000 mg | ORAL_CAPSULE | Freq: Four times a day (QID) | ORAL | Status: DC | PRN
  Administered 2018-05-09 – 2018-05-10 (×3): 25 mg via ORAL
  Filled 2018-05-09 (×3): qty 1

## 2018-05-09 NOTE — Progress Notes (Signed)
[facility] Gastroenterology Progress Note  Omar Rodriguez 28 y.o. 05/11/1990   Subjective: Sleeping soundly. Denies N/V/abdominal pain. Did not like yogurt on tray but tolerated other liquids. Rash on body. Father in room.  Objective: Vital signs: Vitals:   05/08/18 2120 05/09/18 0443  BP: 118/75 108/65  Pulse: 100 95  Resp:  18  Temp: 98.8 F (37.1 C) 98.4 F (36.9 C)  SpO2: 94% 95%    Physical Exam: Gen: lethargic, no acute distress  HEENT: +icteric sclera CV: RRR Chest: CTA B Abd: RUQ tenderness without guarding, soft, nondistended, +BS Ext: no edema Skin: diffuse papular rash on torso and extremities  Lab Results: Recent Labs    05/08/18 0145 05/09/18 0200  NA 130* 130*  K 4.7 4.7  CL 97* 100  CO2 24 24  GLUCOSE 95 98  BUN 6 <5*  CREATININE 1.20 1.21  CALCIUM 7.0* 6.9*  MG 2.0  --    Recent Labs    05/08/18 0145 05/09/18 0200  AST 110* 95*  ALT 143* 119*  ALKPHOS 202* 198*  BILITOT 8.4* 8.2*  PROT 6.0* 5.7*  ALBUMIN 2.6* 2.4*   Recent Labs    05/07/18 0200 05/09/18 0200  WBC 11.1* 9.2  NEUTROABS  --  2.5  HGB 12.3* 12.2*  HCT 38.5* 38.2*  MCV 88.5 86.4  PLT 103* 93*      Assessment/Plan: Acute hepatitis from EBV (mononucleosis) with improving LFTs. Diffuse rash now present and likely due to the mononucleosis but may need derm evaluation if it persists and defer topical treatment and further evaluation to Dr. Sunnie Rodriguez. Slowly advance diet as tolerated. Clinically his EBV hepatitis is improving. Ok to go home from a GI standpoint in next 1-2 days. F/U with GI prn. Will sign off. Call if questions.   Omar Rodriguez 05/09/2018, 11:50 AM  Questions please call [phone] ID: Omar Rodriguez, male   DOB: Jan 22, 1991, 28 y.o.   MRN: [id number]

## 2018-05-09 NOTE — Consult Note (Signed)
[facility]    Date of Admission:  05/04/2018          Reason for Consult: New rash in the setting of acute EBV mononucleosis    Referring Provider: Dr. Hartley Barefoot  Assessment: He has full-blown acute Epstein-Barr virus mononucleosis.  He has developed a diffuse maculopapular rash as a late complication following treatment with ciprofloxacin and metronidazole.  It is well-known that antibiotic therapy can precipitate a hypersensitivity reaction in patients with EBV mononucleosis.  I would continue symptomatic therapy.  I do not recommend treatment with steroids or acyclovir.  All of this should resolve within the next few weeks.  He can be safely discharged home once he is able to take sufficient liquids and food by mouth.  Plan: 1. Symptomatic therapy 2. Please call if we can be of further assistance  Principal Problem:   Acute Epstein Barr virus (EBV) infection Active Problems:   AKI (acute kidney injury) (HCC)   Normocytic anemia   Thrombocytopenia (HCC)   Hepatitis   Splenomegaly   Maculopapular rash   ADHD   Gallstone   Scheduled Meds:  docusate sodium  100 mg Oral BID   famotidine (PEPCID) IV  20 mg Intravenous Q12H   sodium chloride flush  3 mL Intravenous Once   triamcinolone 0.1 % cream : eucerin   Topical TID   Continuous Infusions:  sodium chloride 75 mL/hr at 05/09/18 1344   sodium chloride     PRN Meds:.acetaminophen **OR** acetaminophen, diphenhydrAMINE, morphine injection, ondansetron **OR** ondansetron (ZOFRAN) IV, oxyCODONE, polyethylene glycol  HPI: Omar Rodriguez is a 28 y.o. male who was admitted here on 05/03/2018 with a fever of 101 degrees, acute abdominal pain, nausea, vomiting, elevated liver enzymes and bilirubin.  Ultrasound revealed a solitary gallstone.  MRI did not suggest acute cholecystitis.  He was started on broad empiric antibiotics with ciprofloxacin and metronidazole.  Testing for hepatitis A,  B, C and HIV were negative.  Epstein-Barr virus IgM was elevated.  Epstein-Barr virus DNA PCR was positive.  He has defervesced and his liver enzymes are coming down.  His abdominal pain has improved but he is still having some nausea and difficulty advancing his diet.  He has moderate splenomegaly and has developed acute normocytic anemia and thrombocytopenia.  Two days ago he developed a moderately pruritic diffuse maculopapular rash.   Review of Systems: Review of Systems  Constitutional: Positive for malaise/fatigue. Negative for chills, diaphoresis and fever.  HENT: Negative for sore throat.   Gastrointestinal: Positive for nausea and vomiting. Negative for abdominal pain.  Skin: Positive for itching and rash.    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   Acute Malachi Carl virus (EBV) infection 05/07/2018   ADHD (attention deficit hyperactivity disorder)    Gallbladder disease    Pneumonia     Social History   Tobacco Use   Smoking status: Never Smoker   Smokeless tobacco: Never Used  Substance Use Topics   Alcohol use: No   Drug use: No    Family History  Problem Relation Age of Onset   Diabetes Father    Hypertension Father    Allergies  Allergen Reactions   Ceclor [Cefaclor] Hives    OBJECTIVE: Blood pressure 118/72, pulse 91, temperature 98.3 F (36.8 C), temperature source Oral, resp. rate 17, height 5\' 6"  (1.676 m), weight 83.9 kg, SpO2 97 %.  Physical Exam Constitutional:  Comments: He is sitting up in bed.  He appears comfortable.  His father is visiting.  HENT:     Mouth/Throat:     Comments: Poor dentition.  No oral lesions. Eyes:     Conjunctiva/sclera: Conjunctivae normal.  Cardiovascular:     Rate and Rhythm: Normal rate and regular rhythm.     Heart sounds: No murmur.  Pulmonary:     Effort: Pulmonary effort is normal.     Breath sounds: Normal breath sounds.  Abdominal:     Palpations:  Abdomen is soft.     Tenderness: There is no abdominal tenderness.     Comments: I could not palpate his spleen or liver edge.  Skin:    Findings: Rash present.     Comments: He has a diffuse, red, blanching maculopapular rash most prominent on his hands and arms but also extending to abdomen, flanks and lower legs.  Psychiatric:        Mood and Affect: Mood normal.     Lab Results Lab Results  Component Value Date   WBC 9.2 05/09/2018   HGB 12.2 (L) 05/09/2018   HCT 38.2 (L) 05/09/2018   MCV 86.4 05/09/2018   PLT 93 (L) 05/09/2018    Lab Results  Component Value Date   CREATININE 1.21 05/09/2018   BUN <5 (L) 05/09/2018   NA 130 (L) 05/09/2018   K 4.7 05/09/2018   CL 100 05/09/2018   CO2 24 05/09/2018    Lab Results  Component Value Date   ALT 119 (H) 05/09/2018   AST 95 (H) 05/09/2018   ALKPHOS 198 (H) 05/09/2018   BILITOT 8.2 (H) 05/09/2018     Microbiology: Recent Results (from the past 240 hour(s))  Surgical pcr screen     Status: None   Collection Time: 05/04/18  9:35 AM  Result Value Ref Range Status   MRSA, PCR NEGATIVE NEGATIVE Final   Staphylococcus aureus NEGATIVE NEGATIVE Final    Comment: (NOTE) The Xpert SA Assay (FDA approved for NASAL specimens in patients 75 years of age and older), is one component of a comprehensive surveillance program. It is not intended to diagnose infection nor to guide or monitor treatment. Performed at [facility], [address] [place], [place] [address]     Cliffton Asters, MD [facility] Infectious [facility] [phone] pager   [phone] cell 05/09/2018, 2:43 PM

## 2018-05-09 NOTE — Plan of Care (Signed)
  Problem: Safety: Goal: Ability to remain free from injury will improve 05/09/2018 2318 by Montel Clock, RN Outcome: Progressing 05/09/2018 2318 by Montel Clock, RN Outcome: Progressing   Problem: Pain Managment: Goal: General experience of comfort will improve Outcome: Progressing

## 2018-05-09 NOTE — Progress Notes (Addendum)
PROGRESS NOTE    KARAS MACHNIK  HOY:[id number] DOB: [birth year] DOA: 05/04/2018 PCP: Patient, No Pcp Per   Brief Narrative: 28 year old with past medical history of ADHD: Cholelithiasis, pneumonia, who presents to the emergency department complaining of gradual onset of persistent epigastric and right upper quadrant abdominal pain since 4 days prior to admission.  Patient was seen in the emergency department in [place].  He was told that there were something wrong with his liver and he was sent home.  He presented with worsening abdominal pain and abnormal liver function test.  Patient was admitted to the surgical service for concern of cholecystitis, MRCP more consistent with hepatitis that acute cholecystitis. 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  Assessment & Plan:   Active Problems:   Calculous cholecystitis   Acute Epstein Barr virus (EBV) infection   Acalculous cholecystitis   AKI (acute kidney injury) (HCC)   1-acute hepatitis from EBV: Abdominal pain with elevated bilirubin: MRCP more consistent with hepatitis and acute cholecystitis.  Evaluated by GI who think hepatitis C is related to Epstein-Barr virus with positive IgM. Ciprofloxacin and Flagyl were discontinued. Per GI patient might be able to be discharged in 1 or 2 days if he is able to tolerate diet.  2-splenomegaly; likely secondary to Gemzar virus, mononucleosis He was advised not to do a sport.  3-rash; maculopapular rash he report itching. He has a prior history of similar rash at home after he used certain soaps product. He has no receptive ampicillin. Rash could also be related to EBV.  We will ask ID to evaluate. Will order Eucerin hydrocortisone Benadryl as needed.     Estimated body mass index is 29.86 kg/m as calculated from the following:   Height as of this encounter: 5\' 6"  (1.676 m).   Weight as of this encounter: 83.9 kg.   DVT prophylaxis: SCDs Code Status: Full code Family  Communication: care discussed with father who was at bedside.  Disposition Plan: home when stable.    Consultants:  GI  Procedures:   none   Antimicrobials:    Subjective: He report rash arms and abdomen, knees. Started 3 days ago. More noticeable today. He has prior rash similar at home when he uses certain soups.  He denies swelling of lips, tongue, no difficulty breathing    Objective: Vitals:   05/08/18 0453 05/08/18 1243 05/08/18 2120 05/09/18 0443  BP: 115/70 115/71 118/75 108/65  Pulse: 99 94 100 95  Resp: 16   18  Temp: 98.7 F (37.1 C) 98.2 F (36.8 C) 98.8 F (37.1 C) 98.4 F (36.9 C)  TempSrc: Oral Oral Oral Oral  SpO2: 92% 95% 94% 95%  Weight:      Height:        Intake/Output Summary (Last 24 hours) at 05/09/2018 1030 Last data filed at 05/09/2018 0834 Gross per 24 hour  Intake 3659.26 ml  Output 800 ml  Net 2859.26 ml   Filed Weights   05/04/18 0113 05/04/18 0849  Weight: 88.5 kg 83.9 kg    Examination:  General exam: Appears calm and comfortable  Respiratory system: Clear to auscultation. Respiratory effort normal. Cardiovascular system: S1 & S2 heard, RRR. No JVD, murmurs, rubs, gallops or clicks. No pedal edema. Gastrointestinal system: Abdomen is nondistended, soft and nontender. No organomegaly or masses felt. Normal bowel sounds heard. Central nervous system: Alert and oriented. No focal neurological deficits. Extremities: Symmetric 5 x 5 power. Skin: generalized macular papular rash     Data  Reviewed: I have personally reviewed following labs and imaging studies  CBC: Recent Labs  Lab 05/04/18 0144 05/05/18 0329 05/06/18 0242 05/07/18 0200 05/09/18 0200  WBC 10.2 6.9 9.8 11.1* 9.2  NEUTROABS  --   --   --   --  2.5  HGB 13.8 12.6* 12.5* 12.3* 12.2*  HCT 42.0 37.5* 37.4* 38.5* 38.2*  MCV 87.5 87.8 88.2 88.5 86.4  PLT 135* 102* 104* 103* 93*   Basic Metabolic Panel: Recent Labs  Lab 05/04/18 0144 05/05/18 0329 05/06/18  0242 05/08/18 0145 05/09/18 0200  NA 133* 135 133* 130* 130*  K 3.7 4.0 4.3 4.7 4.7  CL 98 101 100 97* 100  CO2 GLUCOSE 94 107* 108* 95 98  BUN <5*  CREATININE 1.36* 1.31* 1.33* 1.20 1.21  CALCIUM 6.8* 6.5* 6.5* 7.0* 6.9*  MG  --   --   --  2.0  --    GFR: Estimated Creatinine Clearance: 92.3 mL/min (by C-G formula based on SCr of 1.21 mg/dL). Liver Function Tests: Recent Labs  Lab 05/05/18 0329 05/06/18 0242 05/07/18 0200 05/08/18 0145 05/09/18 0200  AST 99* 92* 110* 110* 95*  ALT 215* 178* 160* 143* 119*  ALKPHOS 179* 183* 187* 202* 198*  BILITOT 6.6* 7.1* 7.4* 8.4* 8.2*  PROT 5.7* 5.8* 6.0* 6.0* 5.7*  ALBUMIN 2.5* 2.5* 2.5* 2.6* 2.4*   Recent Labs  Lab 05/04/18 0144  LIPASE 50   No results for input(s): AMMONIA in the last 168 hours. Coagulation Profile: Recent Labs  Lab 05/08/18 0752  INR 1.2   Cardiac Enzymes: No results for input(s): CKTOTAL, CKMB, CKMBINDEX, TROPONINI in the last 168 hours. BNP (last 3 results) No results for input(s): PROBNP in the last 8760 hours. HbA1C: No results for input(s): HGBA1C in the last 72 hours. CBG: No results for input(s): GLUCAP in the last 168 hours. Lipid Profile: No results for input(s): CHOL, HDL, LDLCALC, TRIG, CHOLHDL, LDLDIRECT in the last 72 hours. Thyroid Function Tests: No results for input(s): TSH, T4TOTAL, FREET4, T3FREE, THYROIDAB in the last 72 hours. Anemia Panel: No results for input(s): VITAMINB12, FOLATE, FERRITIN, TIBC, IRON, RETICCTPCT in the last 72 hours. Sepsis Labs: No results for input(s): PROCALCITON, LATICACIDVEN in the last 168 hours.  Recent Results (from the past 240 hour(s))  Surgical pcr screen     Status: None   Collection Time: 05/04/18  9:35 AM  Result Value Ref Range Status   MRSA, PCR NEGATIVE NEGATIVE Final   Staphylococcus aureus NEGATIVE NEGATIVE Final    Comment: (NOTE) The Xpert SA Assay (FDA approved for NASAL specimens in patients 22 years of  age and older), is one component of a comprehensive surveillance program. It is not intended to diagnose infection nor to guide or monitor treatment. Performed at [facility], [address], [place], [place] [address]          Radiology Studies: No results found.      Scheduled Meds: . diphenhydrAMINE  25 mg Oral BID  . docusate sodium  100 mg Oral BID  . famotidine (PEPCID) IV  20 mg Intravenous Q12H  . sodium chloride flush  3 mL Intravenous Once  . triamcinolone 0.1 % cream : eucerin   Topical TID   Continuous Infusions: . dextrose 5 % and 0.45 % NaCl with KCl 20 mEq/L 125 mL/hr at 05/09/18 0851  . sodium chloride       LOS: 3 days  Time spent: 35 minutes.     Alba Cory, MD [facility] Pager [phone] If 7PM-7AM, please contact night-coverage [email or web address] Password [email or web address] 05/09/2018, 10:30 AM

## 2018-05-10 LAB — COMPREHENSIVE METABOLIC PANEL
ALT: 100 U/L — ABNORMAL HIGH (ref 0–44)
ANION GAP: 9 (ref 5–15)
AST: 85 U/L — ABNORMAL HIGH (ref 15–41)
Albumin: 2.5 g/dL — ABNORMAL LOW (ref 3.5–5.0)
Alkaline Phosphatase: 228 U/L — ABNORMAL HIGH (ref 38–126)
BUN: 6 mg/dL (ref 6–20)
CO2: 25 mmol/L (ref 22–32)
Calcium: 7.3 mg/dL — ABNORMAL LOW (ref 8.9–10.3)
Chloride: 99 mmol/L (ref 98–111)
Creatinine, Ser: 1.26 mg/dL — ABNORMAL HIGH (ref 0.61–1.24)
GFR calc non Af Amer: >60 mL/min (ref >60)
Glucose, Bld: 96 mg/dL (ref 70–99)
Potassium: 4.3 mmol/L (ref 3.5–5.1)
Sodium: 133 mmol/L — ABNORMAL LOW (ref 135–145)
Total Bilirubin: 7.6 mg/dL — ABNORMAL HIGH (ref 0.3–1.2)
Total Protein: 6 g/dL — ABNORMAL LOW (ref 6.5–8.1)

## 2018-05-10 MED ORDER — BISACODYL 10 MG RE SUPP
10.0000 mg | Freq: Once | RECTAL | Status: AC
  Administered 2018-05-10: 10 mg via RECTAL
  Filled 2018-05-10: qty 1

## 2018-05-10 NOTE — Progress Notes (Signed)
PROGRESS NOTE    Omar Rodriguez  IOX:[id number] DOB: May 11, 1990 DOA: 05/04/2018 PCP: Patient, No Pcp Per   Brief Narrative: 28 year old with past medical history of ADHD: Cholelithiasis, pneumonia, who presents to the emergency department complaining of gradual onset of persistent epigastric and right upper quadrant abdominal pain since 4 days prior to admission.  Patient was seen in the emergency department in [place].  He was told that there were something wrong with his liver and he was sent home.  He presented with worsening abdominal pain and abnormal liver function test.  Patient was admitted to the surgical service for concern of cholecystitis, MRCP more consistent with hepatitis that acute cholecystitis. 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  Assessment & Plan:   Principal Problem:   Acute Epstein Barr virus (EBV) infection Active Problems:   AKI (acute kidney injury) (HCC)   Normocytic anemia   Thrombocytopenia (HCC)   Hepatitis   Splenomegaly   Maculopapular rash   ADHD   Gallstone   1-Acute hepatitis from EBV : Abdominal pain with elevated bilirubin: MRCP more consistent with hepatitis and acute cholecystitis.  Evaluated by GI who think hepatitis C is related to Epstein-Barr virus with positive IgM. Ciprofloxacin and Flagyl were discontinued. Per GI patient might be able to be discharge when patient is able to tolerate diet. Poor oral intake. Bili, AST, ALt decreasing. Alkaline phosphatase increase today.   2-Splenomegaly; likely secondary to Gemzar virus, mononucleosis He was advised not to do sport.  3-rash; maculopapular rash; He has a prior history of similar rash at home after he used certain soaps product. Rash could also be related to EBV.  Appreciate ID evaluation/ rash likely related to EBV>  Continue with Eucerin hydrocortisone Benadryl as needed.  4-Thrombocytopenia;  Probably related to viral illness, and splenomegaly.  Monitor , follow  trend  Estimated body mass index is 29.86 kg/m as calculated from the following:   Height as of this encounter: 5\' 6"  (1.676 m).   Weight as of this encounter: 83.9 kg.   DVT prophylaxis: SCDs Code Status: Full code Family Communication: care discussed with father who was at bedside.  Disposition Plan: home when stable.    Consultants:  GI  Procedures:   none   Antimicrobials:    Subjective: He is sleeping, wake up answer few questions. No BM in 4 days.    Objective: Vitals:   05/09/18 0443 05/09/18 1325 05/09/18 2024 05/10/18 0516  BP: 108/65 118/72 113/75 107/64  Pulse: 95 91 95 95  Resp: 18 17 17 18   Temp: 98.4 F (36.9 C) 98.3 F (36.8 C) 98.5 F (36.9 C) 98.3 F (36.8 C)  TempSrc: Oral Oral Oral Oral  SpO2: 95% 97% 98% 96%  Weight:      Height:        Intake/Output Summary (Last 24 hours) at 05/10/2018 1009 Last data filed at 05/10/2018 0900 Gross per 24 hour  Intake 3915.84 ml  Output -  Net 3915.84 ml   Filed Weights   05/04/18 0113 05/04/18 0849  Weight: 88.5 kg 83.9 kg    Examination:  General exam: NAD Respiratory system: CTA Cardiovascular system: S 1, S 2 RRR Gastrointestinal system; BS present, soft, Distended Central nervous system sleepy  Extremities:  Symmetric power.  Skin: Generalized maculopapular rash    Data Reviewed: I have personally reviewed following labs and imaging studies  CBC: Recent Labs  Lab 05/04/18 0144 05/05/18 0329 05/06/18 0242 05/07/18 0200 05/09/18 0200  WBC 10.2 6.9  9.8 11.1* 9.2  NEUTROABS  --   --   --   --  2.5  HGB 13.8 12.6* 12.5* 12.3* 12.2*  HCT 42.0 37.5* 37.4* 38.5* 38.2*  MCV 87.5 87.8 88.2 88.5 86.4  PLT 135* 102* 104* 103* 93*   Basic Metabolic Panel: Recent Labs  Lab 05/05/18 0329 05/06/18 0242 05/08/18 0145 05/09/18 0200 05/10/18 0738  NA 135 133* 130* 130* 133*  K 4.0 4.3 4.7 4.7 4.3  CL 101 100 97* 100 99  CO2 25 24 24 24 25   GLUCOSE 107* 108* 95 98 96  BUN 8 7 6   <5* 6  CREATININE 1.31* 1.33* 1.20 1.21 1.26*  CALCIUM 6.5* 6.5* 7.0* 6.9* 7.3*  MG  --   --  2.0  --   --    GFR: Estimated Creatinine Clearance: 88.6 mL/min (A) (by C-G formula based on SCr of 1.26 mg/dL (H)). Liver Function Tests: Recent Labs  Lab 05/06/18 0242 05/07/18 0200 05/08/18 0145 05/09/18 0200 05/10/18 0738  AST 92* 110* 110* 95* 85*  ALT 178* 160* 143* 119* 100*  ALKPHOS 183* 187* 202* 198* 228*  BILITOT 7.1* 7.4* 8.4* 8.2* 7.6*  PROT 5.8* 6.0* 6.0* 5.7* 6.0*  ALBUMIN 2.5* 2.5* 2.6* 2.4* 2.5*   Recent Labs  Lab 05/04/18 0144  LIPASE 50   No results for input(s): AMMONIA in the last 168 hours. Coagulation Profile: Recent Labs  Lab 05/08/18 0752  INR 1.2   Cardiac Enzymes: No results for input(s): CKTOTAL, CKMB, CKMBINDEX, TROPONINI in the last 168 hours. BNP (last 3 results) No results for input(s): PROBNP in the last 8760 hours. HbA1C: No results for input(s): HGBA1C in the last 72 hours. CBG: No results for input(s): GLUCAP in the last 168 hours. Lipid Profile: No results for input(s): CHOL, HDL, LDLCALC, TRIG, CHOLHDL, LDLDIRECT in the last 72 hours. Thyroid Function Tests: No results for input(s): TSH, T4TOTAL, FREET4, T3FREE, THYROIDAB in the last 72 hours. Anemia Panel: No results for input(s): VITAMINB12, FOLATE, FERRITIN, TIBC, IRON, RETICCTPCT in the last 72 hours. Sepsis Labs: No results for input(s): PROCALCITON, LATICACIDVEN in the last 168 hours.  Recent Results (from the past 240 hour(s))  Surgical pcr screen     Status: None   Collection Time: 05/04/18  9:35 AM  Result Value Ref Range Status   MRSA, PCR NEGATIVE NEGATIVE Final   Staphylococcus aureus NEGATIVE NEGATIVE Final    Comment: (NOTE) The Xpert SA Assay (FDA approved for NASAL specimens in patients 91 years of age and older), is one component of a comprehensive surveillance program. It is not intended to diagnose infection nor to guide or monitor treatment. Performed at  [facility], [address], [place], [place] [address]          Radiology Studies: No results found.      Scheduled Meds: . docusate sodium  100 mg Oral BID  . famotidine (PEPCID) IV  20 mg Intravenous Q12H  . sodium chloride flush  3 mL Intravenous Once  . triamcinolone 0.1 % cream : eucerin   Topical TID   Continuous Infusions: . sodium chloride 75 mL/hr at 05/09/18 2237  . sodium chloride       LOS: 4 days    Time spent: 35 minutes.     Alba Cory, MD [facility] Pager [phone] If 7PM-7AM, please contact night-coverage [email or web address] Password [email or web address] 05/10/2018, 10:09 AM

## 2018-05-11 LAB — CBC
HEMATOCRIT: 38.8 % — AB (ref 39.0–52.0)
Hemoglobin: 12.3 g/dL — ABNORMAL LOW (ref 13.0–17.0)
MCH: 27.8 pg (ref 26.0–34.0)
MCHC: 31.7 g/dL (ref 30.0–36.0)
MCV: 87.6 fL (ref 80.0–100.0)
Platelets: 121 10*3/uL — ABNORMAL LOW (ref 150–400)
RBC: 4.43 MIL/uL (ref 4.22–5.81)
RDW: 14.3 % (ref 11.5–15.5)
WBC: 8.3 10*3/uL (ref 4.0–10.5)
nRBC: 0 % (ref 0.0–0.2)

## 2018-05-11 LAB — COMPREHENSIVE METABOLIC PANEL
ALK PHOS: 245 U/L — AB (ref 38–126)
ALT: 90 U/L — ABNORMAL HIGH (ref 0–44)
AST: 77 U/L — ABNORMAL HIGH (ref 15–41)
Albumin: 2.5 g/dL — ABNORMAL LOW (ref 3.5–5.0)
Anion gap: 11 (ref 5–15)
BUN: 8 mg/dL (ref 6–20)
CALCIUM: 7.7 mg/dL — AB (ref 8.9–10.3)
CO2: 24 mmol/L (ref 22–32)
Chloride: 100 mmol/L (ref 98–111)
Creatinine, Ser: 1.16 mg/dL (ref 0.61–1.24)
GFR calc Af Amer: >60 mL/min (ref >60)
Glucose, Bld: 99 mg/dL (ref 70–99)
Potassium: 4.3 mmol/L (ref 3.5–5.1)
Sodium: 135 mmol/L (ref 135–145)
Total Bilirubin: 6.5 mg/dL — ABNORMAL HIGH (ref 0.3–1.2)
Total Protein: 6.1 g/dL — ABNORMAL LOW (ref 6.5–8.1)

## 2018-05-11 MED ORDER — DOCUSATE SODIUM 100 MG PO CAPS: 100.0000 mg | ORAL_CAPSULE | Freq: Two times a day (BID) | ORAL | 0 refills | Status: AC

## 2018-05-11 MED ORDER — POLYETHYLENE GLYCOL 3350 17 G PO PACK: 17.0000 g | PACK | Freq: Every day | ORAL | 0 refills | Status: AC | PRN

## 2018-05-11 MED ORDER — DIPHENHYDRAMINE HCL 25 MG PO CAPS: 25.0000 mg | ORAL_CAPSULE | Freq: Three times a day (TID) | ORAL | 0 refills | Status: AC | PRN

## 2018-05-11 NOTE — Discharge Instructions (Signed)
Please follow with PCP for lab work. You need LFT to check your liver.  Do not take tylenol, it can affect your liver.      Enlarged Spleen  An enlarged spleen (splenomegaly) is when the spleen is larger than normal. This condition is usually noticed when the spleen is almost twice its normal size. The spleen is an organ that is located in the upper left area of the abdomen, just under the ribs. The spleen is like a storage unit for red blood cells, and it also works to filter and clean the blood. It destroys cells that are damaged or worn out. The spleen is also important for fighting disease. An enlarged spleen is usually a sign of another health problem. What are the causes? This condition may be caused by:  Mononucleosis and some other viral infections.  Infection with certain bacteria or parasites.  Liver failure (cirrhosis) and other liver diseases.  Blood diseases, such as hemolytic anemia.  An overactive spleen (hypersplenism).  Blood cancers, such as leukemia or Hodgkin disease.  Metabolic disorders, such as Gaucher disease or Niemann-Pick disease.  Tumors and cysts.  Pressure or blood clots in the veins of the spleen.  Connective tissue disorders, such as lupus or rheumatoid arthritis. What are the signs or symptoms? Symptoms of this condition include:  Pain in the upper left part of the abdomen. The pain may spread to the left shoulder or get worse when you take a breath.  Feeling full without eating or after eating only a small amount.  Feeling tired.  Chronic infections.  Bleeding or bruising easily. In some cases, there are no symptoms. How is this diagnosed? This condition may be diagnosed during a physical exam when the health care provider feels the left upper part of your abdomen. You may also have tests, such as:  Blood tests to check red and white blood cells and other proteins and enzymes.  Imaging tests, such as an abdominal ultrasound, CT  scan, or MRI.  Taking a tissue sample (biopsy) of the liver or bone marrow if there is concern that it is the cause of an enlarged spleen. How is this treated? Treatment for this condition depends on the cause. Treatment aims to manage the conditions that cause swelling of the spleen and reduce the size of the spleen. Treatment may include:  Medicines to treat infection or disease.  Radiation therapy.  Blood transfusions.  Vaccinations. If these treatments do not help or if the cause cannot be found, surgery to remove the spleen (splenectomy) may be recommended. Follow these instructions at home:  Take over-the-counter and prescription medicines only as told by your health care provider.  If you were prescribed an antibiotic medicine, take it as told by your health care provider. Do not stop taking the antibiotic even if you start to feel better.  Follow instructions from your health care provider about limiting your activities. To avoid injury or a ruptured spleen, make sure you: ? Avoid contact sports. ? Wear a seat belt in the car.  Keep all follow-up visits as told by your health care provider. This is important. Contact a health care provider if:  Your symptoms do not improve as expected.  You have a fever or chills.  You feel generally ill.  You have increased pain when you take in a breath. Get help right away if:  You experience an injury or impact to the spleen area.  Your abdominal pain becomes severe.  You feel  dizzy or you faint.  You feel very weak.  You have cold and clammy skin.  You have sweating for no reason.  You have chest pain or difficulty breathing. This information is not intended to replace advice given to you by your health care provider. Make sure you discuss any questions you have with your health care provider. Document Released: 07/21/2009 Document Revised: 07/09/2015 Document Reviewed: 07/21/2014 Elsevier Interactive Patient Education   2019 [employer].

## 2018-05-11 NOTE — Discharge Summary (Addendum)
Physician Discharge Summary  Omar Rodriguez JHE:[id number] DOB: 1991-02-09 DOA: 05/04/2018  PCP: Patient, No Pcp Per  Admit date: 05/04/2018 Discharge date: 05/11/2018  Admitted From: Home  Disposition: Home   Recommendations for Outpatient Follow-up:  1. Follow up with PCP in 1-2 weeks 2. Please obtain BMP/CBC in one week 3. Follow up with GI in couples of weeks for hepatitis after EBV 4. Needs LFT to make sure LFT improving/   Discharge Condition stable.  CODE STATUS: full code Diet recommendation: Heart Healthy, low fat diet   Brief/Interim Summary: 28 year old with past medical history of ADHD: Cholelithiasis, pneumonia, who presents to the emergency department complaining of gradual onset of persistent epigastric and right upper quadrant abdominal pain since 4 days prior to admission.  Patient was seen in the emergency department in [place].  He was told that there were something wrong with his liver and he was sent home.  He presented with worsening abdominal pain and abnormal liver function test.  Patient was admitted to the surgical service for concern of cholecystitis, MRCP more consistent with hepatitis that acute cholecystitis. 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  1-Acute hepatitis from EBV : Abdominal pain with elevated bilirubin: MRCP more consistent with hepatitis and acute cholecystitis.  Evaluated by GI who think hepatitis C is related to Epstein-Barr virus with positive IgM. Ciprofloxacin and Flagyl were discontinued. Per GI patient might be able to be discharge when patient is able to tolerate diet. LFT trending down, AST, ALT, bili, Alkaline phosphatase increase. Patient without RUQ pain or tenderness. I discussed increase Alkaline phosphatase with Dr Bosie Clos on 3-27, increase is likely related to viral hepatitis. Patient will need close follow up.  Patient is eating more, feels better. Plan to discharge today.   2-Splenomegaly; likely secondary to  Gemzar virus, mononucleosis He was advised not to do sport.  3-rash; maculopapular rash; He has a prior history of similar rash at home after he used certain soaps product. Rash could also be related to EBV.  Appreciate ID evaluation/ rash likely related to EBV>  Continue with Eucerin hydrocortisone Benadryl as needed. Rash is better today.   4-Thrombocytopenia;  Probably related to viral illness, and splenomegaly.  Increasing, improved today at 127/   5-Hyponatremia; related to dehydration; improved with IV fluids.   Discharge Diagnoses:  Principal Problem:   Acute Epstein Barr virus (EBV) infection Active Problems:   AKI (acute kidney injury) (HCC)   Normocytic anemia   Thrombocytopenia (HCC)   Hepatitis   Splenomegaly   Maculopapular rash   ADHD   Gallstone    Discharge Instructions  Discharge Instructions    Diet - low sodium heart healthy   Complete by:  As directed    Increase activity slowly   Complete by:  As directed      Allergies as of 05/11/2018      Reactions   Ceclor [cefaclor] Hives      Medication List    STOP taking these medications   azithromycin 250 MG tablet Commonly known as:  ZITHROMAX   dicyclomine 20 MG tablet Commonly known as:  BENTYL   naproxen 500 MG tablet Commonly known as:  NAPROSYN   predniSONE 50 MG tablet Commonly known as:  DELTASONE     TAKE these medications   diphenhydrAMINE 25 mg capsule Commonly known as:  BENADRYL Take 1 capsule (25 mg total) by mouth every 8 (eight) hours as needed for itching or allergies.   docusate sodium 100 MG capsule Commonly  known as:  COLACE Take 1 capsule (100 mg total) by mouth 2 (two) times daily.   oxyCODONE 5 MG immediate release tablet Commonly known as:  Oxy IR/ROXICODONE Take 5 mg by mouth 4 (four) times daily as needed for moderate pain.   pantoprazole 40 MG tablet Commonly known as:  PROTONIX Take 40 mg by mouth daily.   polyethylene glycol packet Commonly  known as:  MIRALAX / GLYCOLAX Take 17 g by mouth daily as needed for mild constipation.      Follow-up Information    [facility].   Specialty:  General Surgery Why:  As needed Contact information: [address] [place] [address] [phone]        Charlott RakesSchooler, Vincent, MD Follow up in 2 week(s).   Specialty:  Gastroenterology Contact information: [address] [place] [address] [phone]          Allergies  Allergen Reactions  . Ceclor [Cefaclor] Hives    Consultations:  GI  Surgery    Procedures/Studies: Mr 3d Recon At Scanner  Result Date: 05/04/2018 CLINICAL DATA:  Right upper quadrant abdominal pain with dilated liver function studies and cholelithiasis. EXAM: MRI ABDOMEN WITHOUT AND WITH CONTRAST (INCLUDING MRCP) TECHNIQUE: Multiplanar multisequence MR imaging of the abdomen was performed both before and after the administration of intravenous contrast. Heavily T2-weighted images of the biliary and pancreatic ducts were obtained, and three-dimensional MRCP images were rendered by post processing. CONTRAST:  8 cc Gadavist COMPARISON:  Abdominal ultrasound 05/04/2018.  Chest CT 01/13/2014. FINDINGS: Despite efforts by the technologist and patient, moderate motion artifact is present on today's exam and could not be eliminated. This reduces exam sensitivity and specificity. The thin section MRCP images are essentially nondiagnostic. Lower chest: Trace bilateral pleural effusions. The visualized lung bases are otherwise unremarkable. Hepatobiliary: No significant steatosis or morphologic changes of cirrhosis. On the immediate postcontrast images, there is mildly heterogeneous parenchymal enhancement, suggesting passive congestion. This is associated with mild periportal edema. No focal lesions are identified on delayed imaging. As on ultrasound, there is a 16 mm gallstone and mild gallbladder wall thickening. There  is a small amount of pericholecystic fluid. Although the MRCP images are limited, there is no biliary dilatation or evidence of choledocholithiasis. Pancreas: Unremarkable. No pancreatic ductal dilatation or surrounding inflammatory changes. Spleen: Measures 16.6 x 16.0 x 7.8 cm (volume = 1100 cm^3) consistent with moderate splenomegaly. No focal abnormality. Adrenals/Urinary Tract: Both adrenal glands appear normal. The kidneys and proximal ureters appear normal. No hydronephrosis. Stomach/Bowel: No evidence of bowel wall thickening, distention or surrounding inflammatory change. Vascular/Lymphatic: There are few scattered prominent mesenteric lymph nodes which are likely reactive. No retroperitoneal adenopathy. No significant vascular findings. The portal, superior mesenteric and splenic veins are patent. Other: Mild mesenteric edema and trace perihepatic ascites. No focal extraluminal fluid collection. Musculoskeletal: No acute or significant osseous findings. IMPRESSION: 1. Cholelithiasis with nonspecific mild gallbladder wall thickening and pericholecystic fluid. The gallbladder wall thickening is likely secondary to adjacent inflammation. 2. No evidence of biliary dilatation or choledocholithiasis. MRCP images are limited. 3. Periportal edema and heterogeneous early hepatic parenchymal enhancement, most consistent with chronic passive congestion or hepatitis. No focal hepatic abnormality. 4. Trace ascites and bilateral pleural effusions. 5. Moderate splenomegaly. Electronically Signed   By: Carey BullocksWilliam  Veazey M.D.   On: 05/04/2018 11:43   [place] Abdomen Limited  Result Date: 05/04/2018 CLINICAL DATA:  Right upper quadrant pain with elevated AST and ALT  EXAM: ULTRASOUND ABDOMEN LIMITED RIGHT UPPER QUADRANT COMPARISON:  None. FINDINGS: Gallbladder: A single large gallstone is noted measuring 2.1 cm. Gallbladder wall is thickened to 6 mm without striation or pericholecystic edema. Negative Murphy sign. Common bile  duct: Diameter: 5-6 mm. Where visualized, no filling defect. On a few images portal triads are somewhat prominent such that intrahepatic biliary dilatation is a consideration. Liver: No focal lesion identified. Within normal limits in parenchymal echogenicity. Portal vein is patent on color Doppler imaging with normal direction of blood flow towards the liver. IMPRESSION: 1. Cholelithiasis. Equivocal for cholecystitis as there is gallbladder wall thickening without reported Murphy sign or over distension. The thickening could be reactive. 2. Normal common bile duct diameter. 3. Somewhat prominent portal triads which may be a sign of hepatitis or mild intrahepatic biliary dilatation. Given labs, consider MRI. Electronically Signed   By: Marnee Spring M.D.   On: 05/04/2018 04:40   Mr Abdomen Mrcp Vivien Rossetti Contast  Result Date: 05/04/2018 CLINICAL DATA:  Right upper quadrant abdominal pain with dilated liver function studies and cholelithiasis. EXAM: MRI ABDOMEN WITHOUT AND WITH CONTRAST (INCLUDING MRCP) TECHNIQUE: Multiplanar multisequence MR imaging of the abdomen was performed both before and after the administration of intravenous contrast. Heavily T2-weighted images of the biliary and pancreatic ducts were obtained, and three-dimensional MRCP images were rendered by post processing. CONTRAST:  8 cc Gadavist COMPARISON:  Abdominal ultrasound 05/04/2018.  Chest CT 01/13/2014. FINDINGS: Despite efforts by the technologist and patient, moderate motion artifact is present on today's exam and could not be eliminated. This reduces exam sensitivity and specificity. The thin section MRCP images are essentially nondiagnostic. Lower chest: Trace bilateral pleural effusions. The visualized lung bases are otherwise unremarkable. Hepatobiliary: No significant steatosis or morphologic changes of cirrhosis. On the immediate postcontrast images, there is mildly heterogeneous parenchymal enhancement, suggesting passive  congestion. This is associated with mild periportal edema. No focal lesions are identified on delayed imaging. As on ultrasound, there is a 16 mm gallstone and mild gallbladder wall thickening. There is a small amount of pericholecystic fluid. Although the MRCP images are limited, there is no biliary dilatation or evidence of choledocholithiasis. Pancreas: Unremarkable. No pancreatic ductal dilatation or surrounding inflammatory changes. Spleen: Measures 16.6 x 16.0 x 7.8 cm (volume = 1100 cm^3) consistent with moderate splenomegaly. No focal abnormality. Adrenals/Urinary Tract: Both adrenal glands appear normal. The kidneys and proximal ureters appear normal. No hydronephrosis. Stomach/Bowel: No evidence of bowel wall thickening, distention or surrounding inflammatory change. Vascular/Lymphatic: There are few scattered prominent mesenteric lymph nodes which are likely reactive. No retroperitoneal adenopathy. No significant vascular findings. The portal, superior mesenteric and splenic veins are patent. Other: Mild mesenteric edema and trace perihepatic ascites. No focal extraluminal fluid collection. Musculoskeletal: No acute or significant osseous findings. IMPRESSION: 1. Cholelithiasis with nonspecific mild gallbladder wall thickening and pericholecystic fluid. The gallbladder wall thickening is likely secondary to adjacent inflammation. 2. No evidence of biliary dilatation or choledocholithiasis. MRCP images are limited. 3. Periportal edema and heterogeneous early hepatic parenchymal enhancement, most consistent with chronic passive congestion or hepatitis. No focal hepatic abnormality. 4. Trace ascites and bilateral pleural effusions. 5. Moderate splenomegaly. Electronically Signed   By: Carey Bullocks M.D.   On: 05/04/2018 11:43     Subjective: He  Is feeling better.he is alert. He started to eat more. He had large BM yesterday. He denies abdominal pain   Discharge Exam: Vitals:   05/10/18 2046  05/11/18 0442  BP: 121/78 (!) 96/56  Pulse: 79 78  Resp: 18 17  Temp: 98 F (36.7 C) 98.3 F (36.8 C)  SpO2: 96% 96%     General: Pt is alert, awake, not in acute distress Cardiovascular: RRR, S1/S2 +, no rubs, no gallops Respiratory: CTA bilaterally, no wheezing, no rhonchi Abdominal: Soft, NT, ND, bowel sounds + no RUQ pain on palpation.  Extremities: no edema, no cyanosis    The results of significant diagnostics from this hospitalization (including imaging, microbiology, ancillary and laboratory) are listed below for reference.     Microbiology: Recent Results (from the past 240 hour(s))  Surgical pcr screen     Status: None   Collection Time: 05/04/18  9:35 AM  Result Value Ref Range Status   MRSA, PCR NEGATIVE NEGATIVE Final   Staphylococcus aureus NEGATIVE NEGATIVE Final    Comment: (NOTE) The Xpert SA Assay (FDA approved for NASAL specimens in patients 66 years of age and older), is one component of a comprehensive surveillance program. It is not intended to diagnose infection nor to guide or monitor treatment. Performed at [facility], [address] [place], [place] [address]      Labs: BNP (last 3 results) No results for input(s): BNP in the last 8760 hours. Basic Metabolic Panel: Recent Labs  Lab 05/06/18 0242 05/08/18 0145 05/09/18 0200 05/10/18 0738 05/11/18 0739  NA 133* 130* 130* 133* 135  K 4.3 4.7 4.7 4.3 4.3  CL 100 97* 100 99 100  CO2 GLUCOSE 108* 95 98 96 99  BUN 7 6 <5* 6 8  CREATININE 1.33* 1.20 1.21 1.26* 1.16  CALCIUM 6.5* 7.0* 6.9* 7.3* 7.7*  MG  --  2.0  --   --   --    Liver Function Tests: Recent Labs  Lab 05/07/18 0200 05/08/18 0145 05/09/18 0200 05/10/18 0738 05/11/18 0739  AST 110* 110* 95* 85* 77*  ALT 160* 143* 119* 100* 90*  ALKPHOS 187* 202* 198* 228* 245*  BILITOT 7.4* 8.4* 8.2* 7.6* 6.5*  PROT 6.0* 6.0* 5.7* 6.0* 6.1*  ALBUMIN 2.5* 2.6* 2.4* 2.5* 2.5*   No results for input(s):  LIPASE, AMYLASE in the last 168 hours. No results for input(s): AMMONIA in the last 168 hours. CBC: Recent Labs  Lab 05/05/18 0329 05/06/18 0242 05/07/18 0200 05/09/18 0200 05/11/18 0739  WBC 6.9 9.8 11.1* 9.2 8.3  NEUTROABS  --   --   --  2.5  --   HGB 12.6* 12.5* 12.3* 12.2* 12.3*  HCT 37.5* 37.4* 38.5* 38.2* 38.8*  MCV 87.8 88.2 88.5 86.4 87.6  PLT 102* 104* 103* 93* 121*   Cardiac Enzymes: No results for input(s): CKTOTAL, CKMB, CKMBINDEX, TROPONINI in the last 168 hours. BNP: Invalid input(s): POCBNP CBG: No results for input(s): GLUCAP in the last 168 hours. D-Dimer No results for input(s): DDIMER in the last 72 hours. Hgb A1c No results for input(s): HGBA1C in the last 72 hours. Lipid Profile No results for input(s): CHOL, HDL, LDLCALC, TRIG, CHOLHDL, LDLDIRECT in the last 72 hours. Thyroid function studies No results for input(s): TSH, T4TOTAL, T3FREE, THYROIDAB in the last 72 hours.  Invalid input(s): FREET3 Anemia work up No results for input(s): VITAMINB12, FOLATE, FERRITIN, TIBC, IRON, RETICCTPCT in the last 72 hours. Urinalysis    Component Value Date/Time   COLORURINE AMBER (A) 05/04/2018 0145   APPEARANCEUR CLOUDY (A) 05/04/2018 0145   LABSPEC 1.031 (H) 05/04/2018 0145   PHURINE 5.0 05/04/2018 0145   GLUCOSEU  NEGATIVE 05/04/2018 0145   HGBUR MODERATE (A) 05/04/2018 0145   BILIRUBINUR MODERATE (A) 05/04/2018 0145   KETONESUR NEGATIVE 05/04/2018 0145   PROTEINUR 100 (A) 05/04/2018 0145   UROBILINOGEN 2.0 (H) 01/15/2014 1321   NITRITE NEGATIVE 05/04/2018 0145   LEUKOCYTESUR NEGATIVE 05/04/2018 0145   Sepsis Labs Invalid input(s): PROCALCITONIN,  WBC,  LACTICIDVEN Microbiology Recent Results (from the past 240 hour(s))  Surgical pcr screen     Status: None   Collection Time: 05/04/18  9:35 AM  Result Value Ref Range Status   MRSA, PCR NEGATIVE NEGATIVE Final   Staphylococcus aureus NEGATIVE NEGATIVE Final    Comment: (NOTE) The Xpert SA Assay  (FDA approved for NASAL specimens in patients 67 years of age and older), is one component of a comprehensive surveillance program. It is not intended to diagnose infection nor to guide or monitor treatment. Performed at [facility], [address], [place], [place] [address]      Time coordinating discharge: 40 minutes  SIGNED:   Alba Cory, MD  [facility]

## 2020-11-04 ENCOUNTER — Emergency Department (HOSPITAL_COMMUNITY)
Admission: EM | Admit: 2020-11-04 | Discharge: 2020-11-04 | Disposition: A | Discharge status: Home / Self Care | Payer: 59 | Attending: Emergency Medicine | Admitting: Emergency Medicine

## 2020-11-04 ENCOUNTER — Encounter (HOSPITAL_COMMUNITY): Payer: Self-pay | Admitting: Emergency Medicine

## 2020-11-04 DIAGNOSIS — J339 Nasal polyp, unspecified: Secondary | ICD-10-CM | POA: Insufficient documentation

## 2020-11-04 NOTE — ED Triage Notes (Signed)
Pt reports left nasal polyp that has been there for a while. Called for ENT appt and was told would need to wait 2 months to be seen. Pt NAD, no fever, no difficulty breathing, VSS.

## 2020-11-04 NOTE — Discharge Instructions (Addendum)
You came to the emergency department today to be evaluated for your nasal growth.  Your physical exam was reassuring.  You will need to follow-up with [facility] ENT in the outpatient setting.  Please call their office today to schedule a follow-up appointment.  Get help right away if: It is hard to breathe and you feel like you are not getting enough air (short of breath).

## 2020-11-04 NOTE — ED Provider Notes (Signed)
[facility] EMERGENCY DEPARTMENT Provider Note   CSN: [id number] Arrival date & time: 11/04/20  1212     History Chief Complaint  Patient presents with   Nasal Polyps    Omar Rodriguez is a 30 y.o. male with medical history as noted below.  Presents to the emergency department with a chief complaint of growth to left nare.  Patient reports that she first noticed the growth 2 days prior.  Growth has remained the same size since then.  Patient reports that he had some bleeding from growth due to irritation.  Patient denies any pain or discomfort.  Patient has not tried any modalities to alleviate his growth.    Patient reports that he was seen by provider in [place] who diagnosed him with nasal polyp.  Was told to follow-up with ENT.  Patient states that earliest ENT follow-up was 2 months prior which prompted him to come to the emergency department today for further evaluation.  Patient denies any illicit drug use.  Patient denies snorting any foreign substances.  No recent nasal trauma.  The history is provided by the patient.      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 Authorizing Provider  diphenhydrAMINE (BENADRYL) 25 mg capsule Take 1 capsule (25 mg total) by mouth every 8 (eight) hours as needed for itching or allergies. 05/11/18   Regalado, Belkys A, MD  docusate sodium (COLACE) 100 MG capsule Take 1 capsule (100 mg total) by mouth 2 (two) times daily. 05/11/18   Regalado, Belkys A, MD  oxyCODONE (OXY IR/ROXICODONE) 5 MG immediate release tablet Take 5 mg by mouth 4 (four) times daily as needed for moderate pain.  05/03/18   [provider]  pantoprazole (PROTONIX) 40 MG tablet Take 40 mg by mouth daily. 05/03/18   [provider]  polyethylene glycol (MIRALAX / GLYCOLAX) packet Take 17 g by mouth daily as needed for mild constipation. 05/11/18   Regalado, Prentiss Bells, MD    Allergies    Ceclor [cefaclor]  Review of Systems   Review of Systems  Constitutional:  Negative for chills and fever.  HENT:  Positive for congestion and rhinorrhea. Negative for nosebleeds.   Eyes:  Negative for visual disturbance.  Skin:  Negative for color change, pallor, rash and wound.  Allergic/Immunologic: Negative for immunocompromised state.  Neurological:  Negative for facial asymmetry, weakness and numbness.  Physical Exam Updated Vital Signs BP (!) 138/109 (BP Location: Left Arm)   Pulse 77   Temp 98.1 F (36.7 C) (Oral)   Resp 16   Ht 5\' 6"  (1.676 m)   Wt 83 kg   SpO2 100%   BMI 29.54 kg/m   Physical Exam Vitals and nursing note reviewed.  Constitutional:      General: He is not in acute distress.    Appearance: He is not ill-appearing, toxic-appearing or diaphoretic.  HENT:     Head: Normocephalic.     Nose: No nasal tenderness.     Right Nostril: No foreign body, epistaxis, septal hematoma or occlusion.     Right Turbinates: Not enlarged, swollen or pale.     Right Sinus: No  maxillary sinus tenderness or frontal sinus tenderness.     Left Sinus: No maxillary sinus tenderness or frontal sinus tenderness.     Comments: Patient has growth to left nare.  Growth almost completely occludes left nare.  Unable to visualize turbinates c to left nare.    Mouth/Throat:     Lips: Pink. No lesions.     Mouth: Mucous membranes are moist.     Dentition: Abnormal dentition. Dental caries present.     Pharynx: Oropharynx is clear. Uvula midline. No pharyngeal swelling, oropharyngeal exudate, posterior oropharyngeal erythema or uvula swelling.     Tonsils: No tonsillar exudate or tonsillar abscesses.     Comments: Poor dentition with multiple missing teeth, damaged teeth, and dental caries.  Patient able to Gouru handle oral secretions without difficulty.  No swelling to oropharynx or oral mucosa. Eyes:     General: No scleral icterus.       Right eye: No discharge.        Left eye: No discharge.  Cardiovascular:     Rate and Rhythm: Normal rate.  Pulmonary:     Effort: Pulmonary effort is normal.  Musculoskeletal:     Cervical back: Normal range of motion and neck supple.  Skin:    General: Skin is warm and dry.  Neurological:     General: No focal deficit present.     Mental Status: He is alert and oriented to person, place, and time.     GCS: GCS eye subscore is 4. GCS verbal subscore is 5. GCS motor subscore is 6.  Psychiatric:        Behavior: Behavior is cooperative.      ED Results / Procedures / Treatments   Labs (all labs ordered are listed, but only abnormal results are displayed) Labs Reviewed - No data to display  EKG None  Radiology No results found.  Procedures Procedures   Medications Ordered in ED Medications - No data to display  ED Course  I have reviewed the triage vital signs and the nursing notes.  Pertinent labs & imaging results that were available during my care of the patient were reviewed by me and considered in my medical  decision making (see chart for details).    MDM Rules/Calculators/A&P                           Alert 30 year old male in no acute distress, nontoxic-appearing.  Presents with chief complaint of nasal growth to left nare.  Based on physical exam suspect possible nasal polyp.  We will have patient follow-up with ENT in outpatient setting.  Patient care and treatment were just with attending physician Dr. 38.  Final  Clinical Impression(s) / ED Diagnoses Final diagnoses:  Nasal polyp    Rx / DC Orders ED Discharge Orders     None        Berneice Heinrich 11/04/20 1322    Gerhard Munch, MD 11/04/20 1635

## 2020-11-14 IMAGING — US ULTRASOUND ABDOMEN LIMITED
1 series · 14 of 25 positions shown · non-contrast
Comparison: None.

CLINICAL DATA: Right upper quadrant pain with elevated AST and ALT

EXAM:
ULTRASOUND ABDOMEN LIMITED RIGHT UPPER QUADRANT

[Series 1: ultrasound abdomen limited · 14 of 32 slices shown]
[im 1/32]
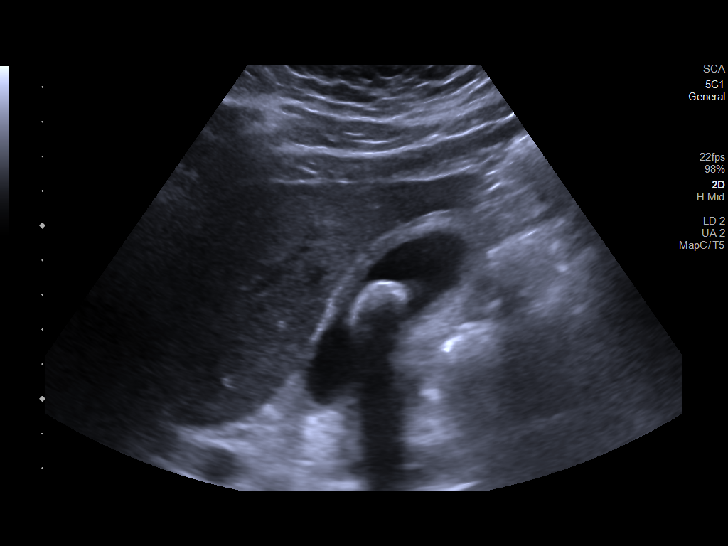
[im 3/32]
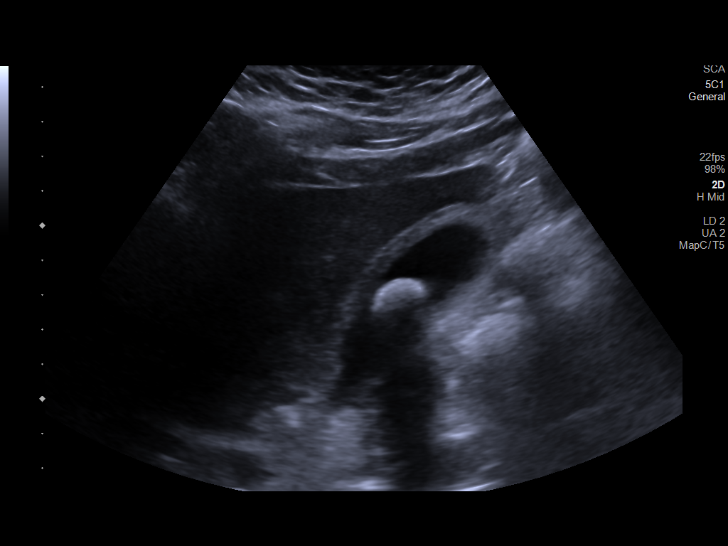
[im 6/32]
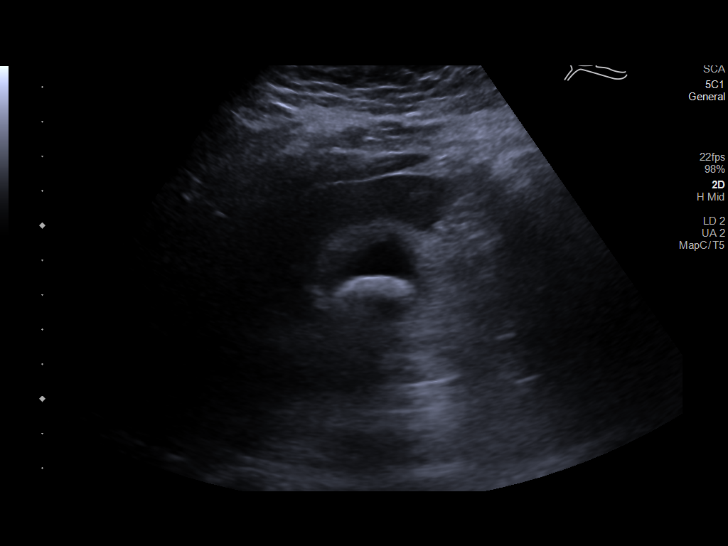
[im 8/32]
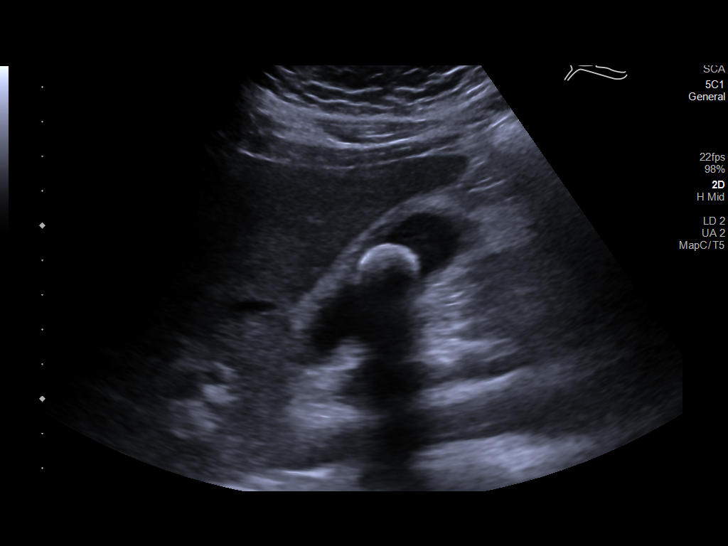
[im 11/32]
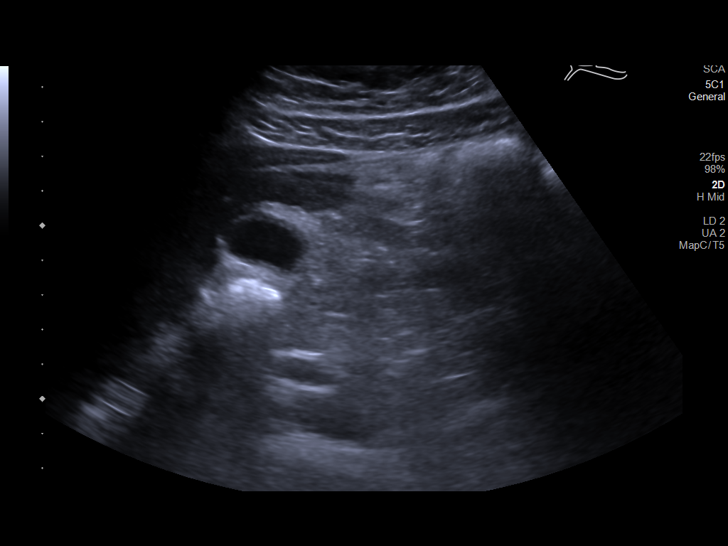
[im 12/32]
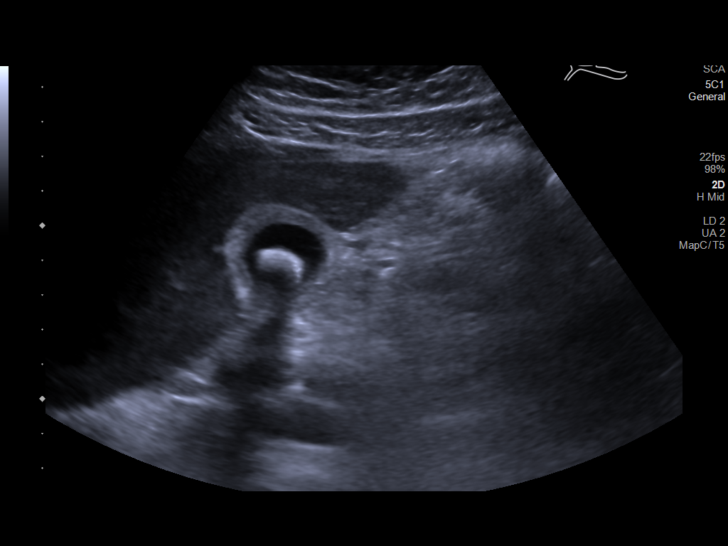
[im 15/32]
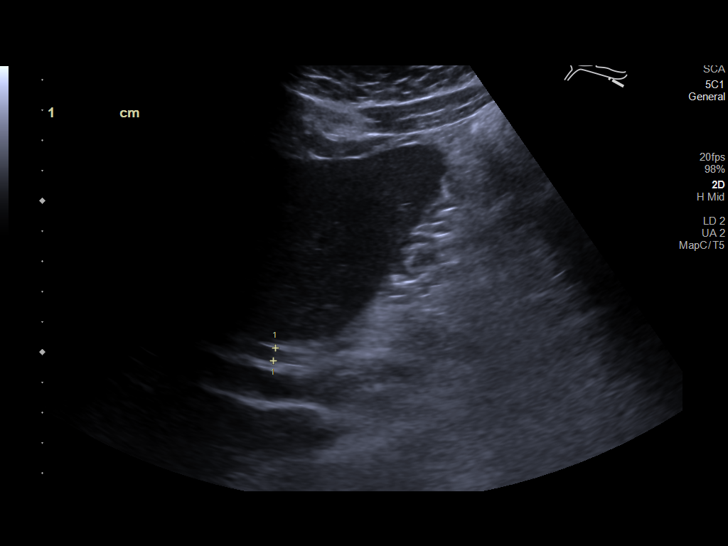
[im 17/32]
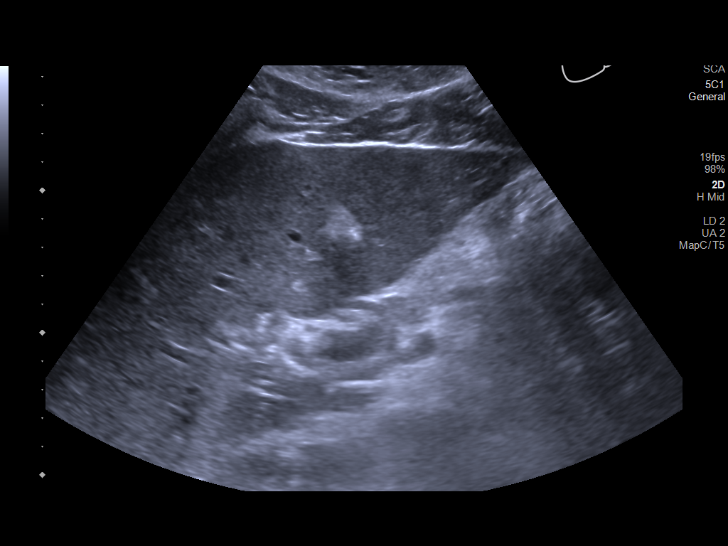
[im 20/32]
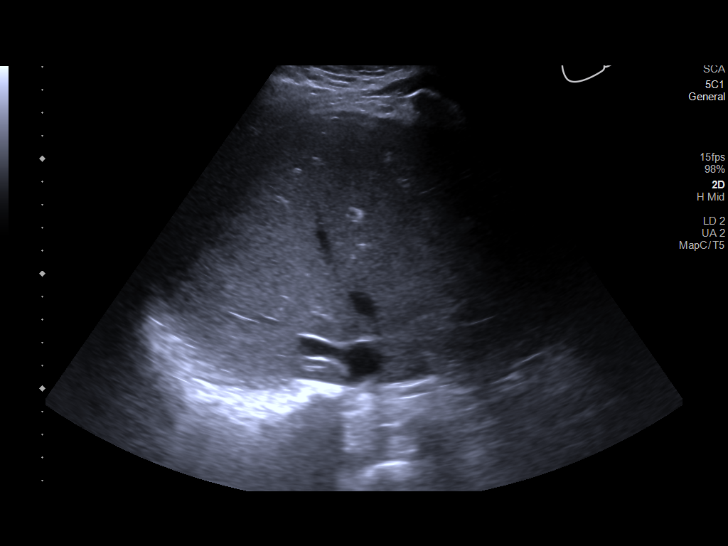
[im 21/32]
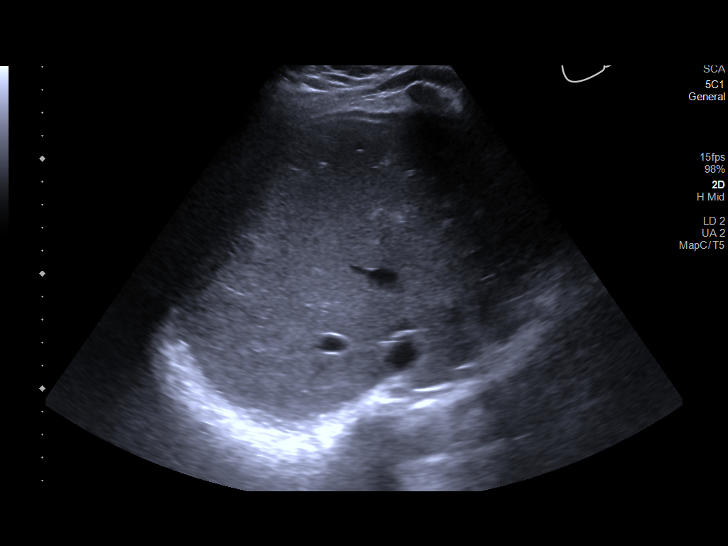
[im 24/32]
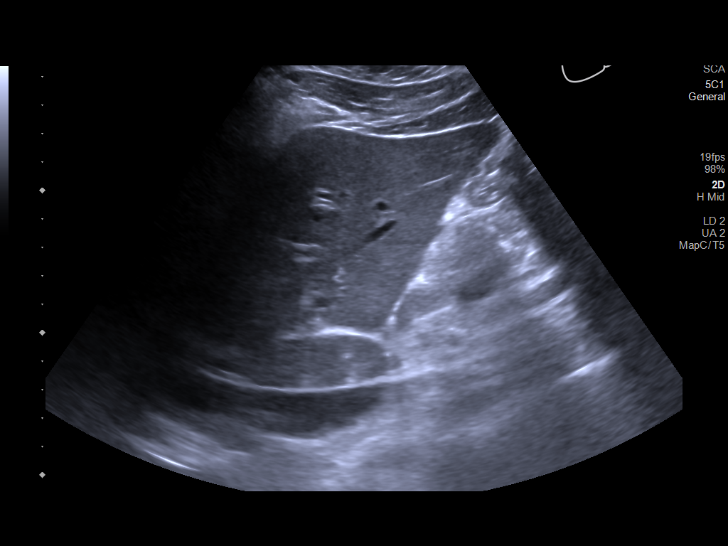
[im 26/32]
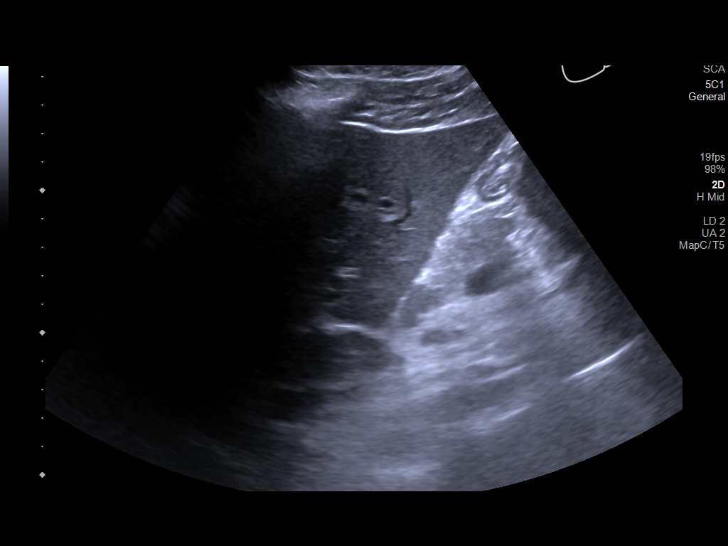
[im 29/32]
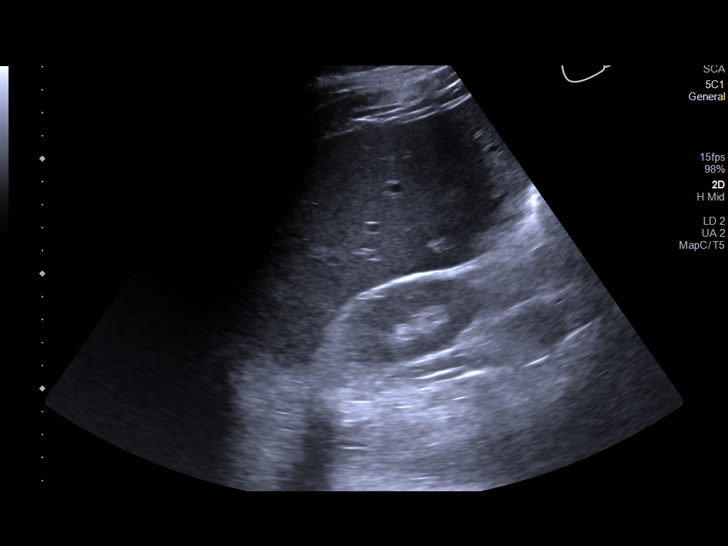
[im 32/32]
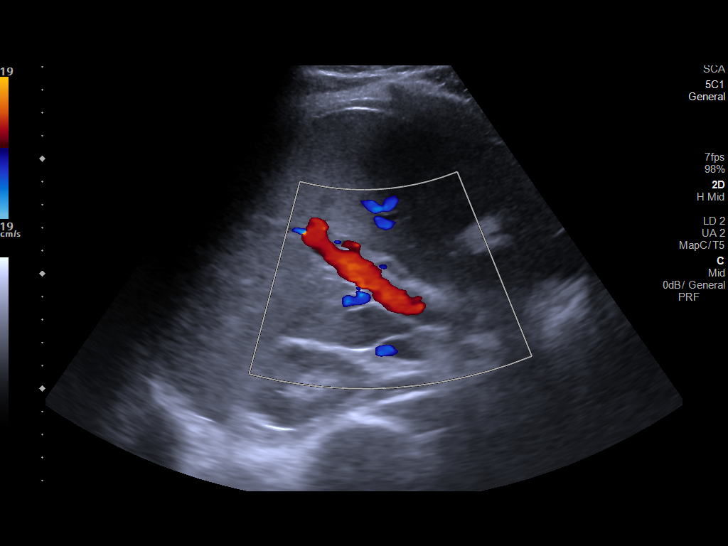

[14 of 25 positions shown; findings below may reference images not displayed]

FINDINGS: Gallbladder:

A single large gallstone is noted measuring 2.1 cm. Gallbladder wall
is thickened to 6 mm without striation or pericholecystic edema.
Negative Murphy sign.

Common bile duct:

Diameter: 5-6 mm. Where visualized, no filling defect. On a few
images portal triads are somewhat prominent such that intrahepatic
biliary dilatation is a consideration.

Liver:

No focal lesion identified. Within normal limits in parenchymal
echogenicity. Portal vein is patent on color Doppler imaging with
normal direction of blood flow towards the liver.
IMPRESSION: 1. Cholelithiasis. Equivocal for cholecystitis as there is
gallbladder wall thickening without reported Murphy sign or over
distension. The thickening could be reactive.
2. Normal common bile duct diameter.
3. Somewhat prominent portal triads which may be a sign of hepatitis
or mild intrahepatic biliary dilatation. Given labs, consider MRI.
